# Patient Record
Sex: Female | Born: 1980
Health system: Southern US, Community
[De-identification: ages and names within clinical notes are randomized; demographics above are authoritative.]

## PROBLEM LIST (undated history)

## (undated) DIAGNOSIS — E559 Vitamin D deficiency, unspecified: Secondary | ICD-10-CM

## (undated) DIAGNOSIS — F329 Major depressive disorder, single episode, unspecified: Secondary | ICD-10-CM

## (undated) DIAGNOSIS — D229 Melanocytic nevi, unspecified: Secondary | ICD-10-CM

## (undated) DIAGNOSIS — G4700 Insomnia, unspecified: Secondary | ICD-10-CM

## (undated) DIAGNOSIS — J45909 Unspecified asthma, uncomplicated: Secondary | ICD-10-CM

## (undated) DIAGNOSIS — E538 Deficiency of other specified B group vitamins: Secondary | ICD-10-CM

## (undated) DIAGNOSIS — F419 Anxiety disorder, unspecified: Secondary | ICD-10-CM

## (undated) DIAGNOSIS — R739 Hyperglycemia, unspecified: Secondary | ICD-10-CM

## (undated) DIAGNOSIS — N6315 Unspecified lump in the right breast, overlapping quadrants: Secondary | ICD-10-CM

## (undated) DIAGNOSIS — R5383 Other fatigue: Secondary | ICD-10-CM

## (undated) DIAGNOSIS — L709 Acne, unspecified: Secondary | ICD-10-CM

## (undated) DIAGNOSIS — K589 Irritable bowel syndrome without diarrhea: Secondary | ICD-10-CM

## (undated) DIAGNOSIS — F32A Depression, unspecified: Secondary | ICD-10-CM

## (undated) DIAGNOSIS — N6310 Unspecified lump in the right breast, unspecified quadrant: Secondary | ICD-10-CM

## (undated) HISTORY — DX: Deficiency of other specified B group vitamins: E53.8

## (undated) HISTORY — PX: CHOLECYSTECTOMY: SHX55

## (undated) HISTORY — DX: Anxiety disorder, unspecified: F41.9

## (undated) HISTORY — DX: Unspecified lump in the right breast, unspecified quadrant: N63.10

## (undated) HISTORY — DX: Unspecified lump in the right breast, overlapping quadrants: N63.15

## (undated) HISTORY — DX: Other fatigue: R53.83

## (undated) HISTORY — DX: Unspecified asthma, uncomplicated: J45.909

## (undated) HISTORY — DX: Melanocytic nevi, unspecified: D22.9

## (undated) HISTORY — DX: Hyperglycemia, unspecified: R73.9

## (undated) HISTORY — DX: Depression, unspecified: F32.A

## (undated) HISTORY — DX: Major depressive disorder, single episode, unspecified: F32.9

## (undated) HISTORY — PX: TONSILLECTOMY AND ADENOIDECTOMY: SUR1326

## (undated) HISTORY — DX: Vitamin D deficiency, unspecified: E55.9

## (undated) HISTORY — DX: Insomnia, unspecified: G47.00

## (undated) HISTORY — DX: Irritable bowel syndrome, unspecified: K58.9

## (undated) HISTORY — DX: Acne, unspecified: L70.9

---

## 2008-08-19 ENCOUNTER — Inpatient Hospital Stay: Payer: Self-pay | Admitting: Unknown Physician Specialty

## 2009-01-18 ENCOUNTER — Ambulatory Visit: Payer: Self-pay | Admitting: Family Medicine

## 2009-03-01 ENCOUNTER — Ambulatory Visit: Payer: Self-pay | Admitting: Family Medicine

## 2009-03-01 IMAGING — CR DG LUMBAR SPINE 2-3V
1 series · 3 of 3 positions shown · non-contrast
Comparison: none

REASON FOR EXAM: back pain
COMMENTS:

[Series 2: view not recorded · 0.17mm/px · 3 of 3 slices shown]
[im 1/3]
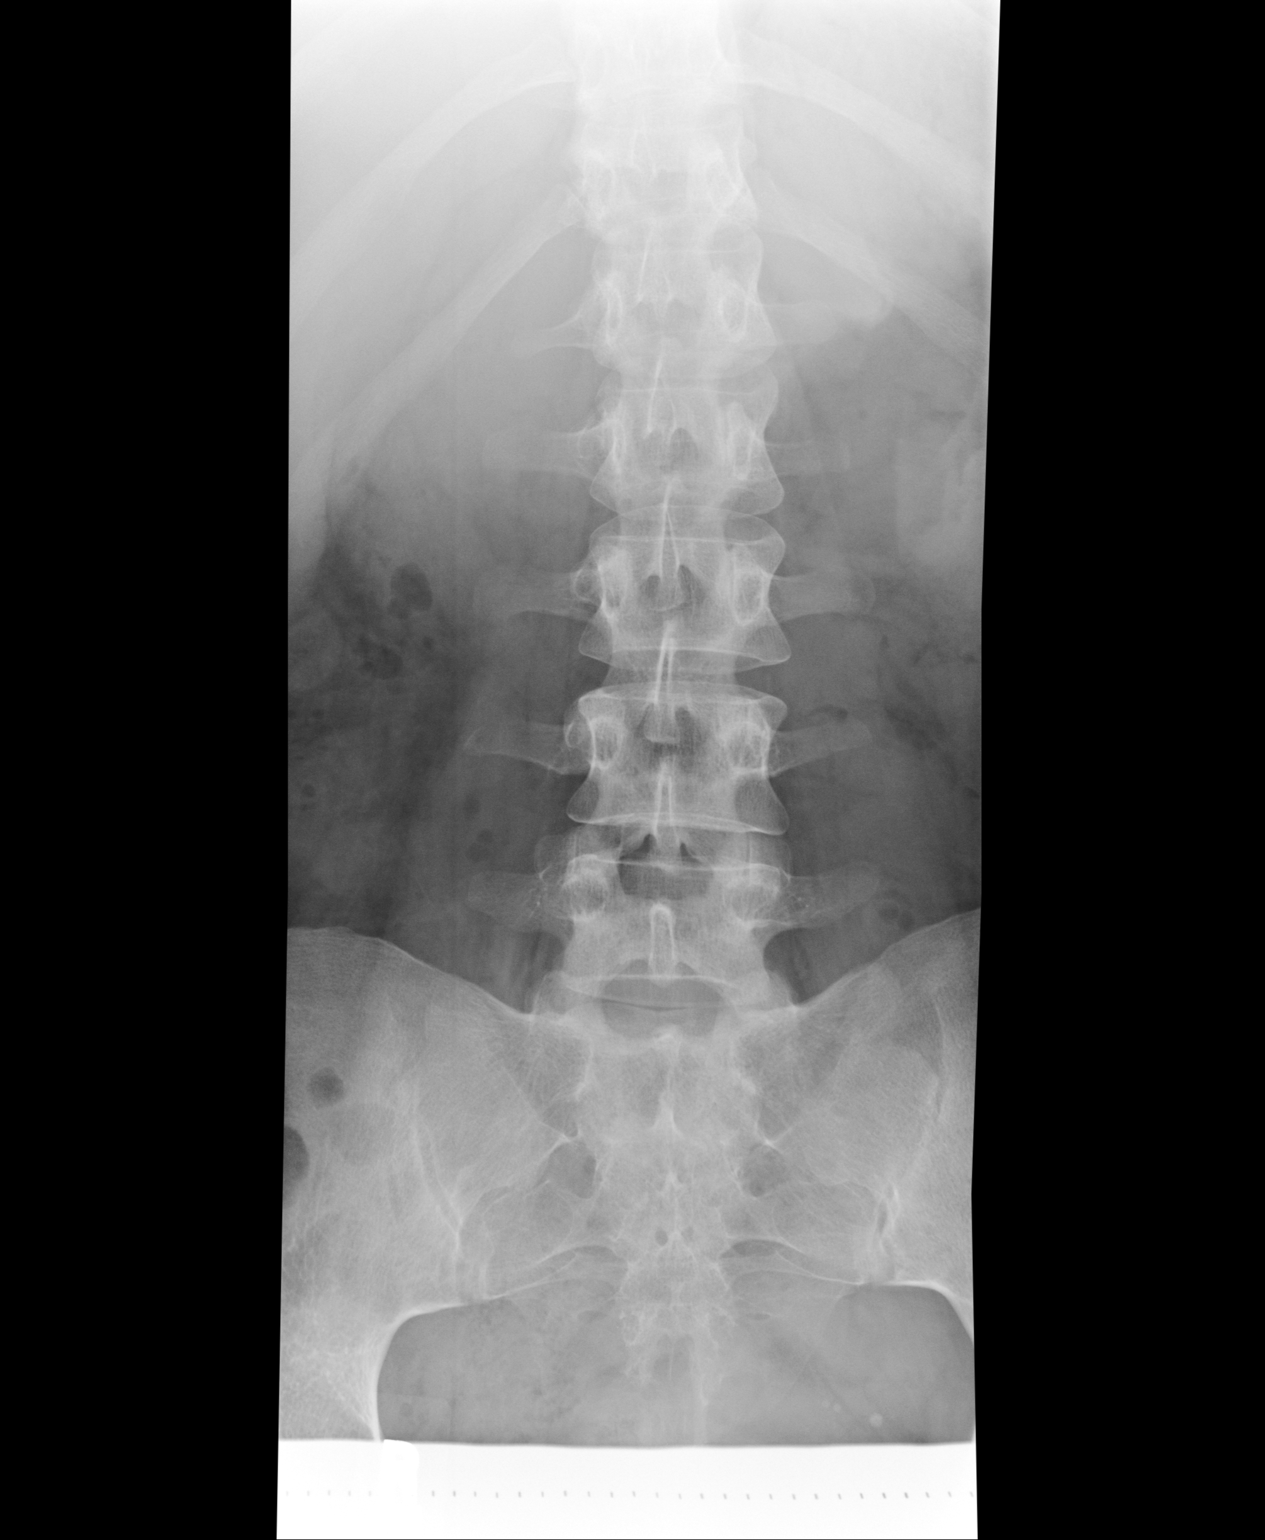
[im 2/3]
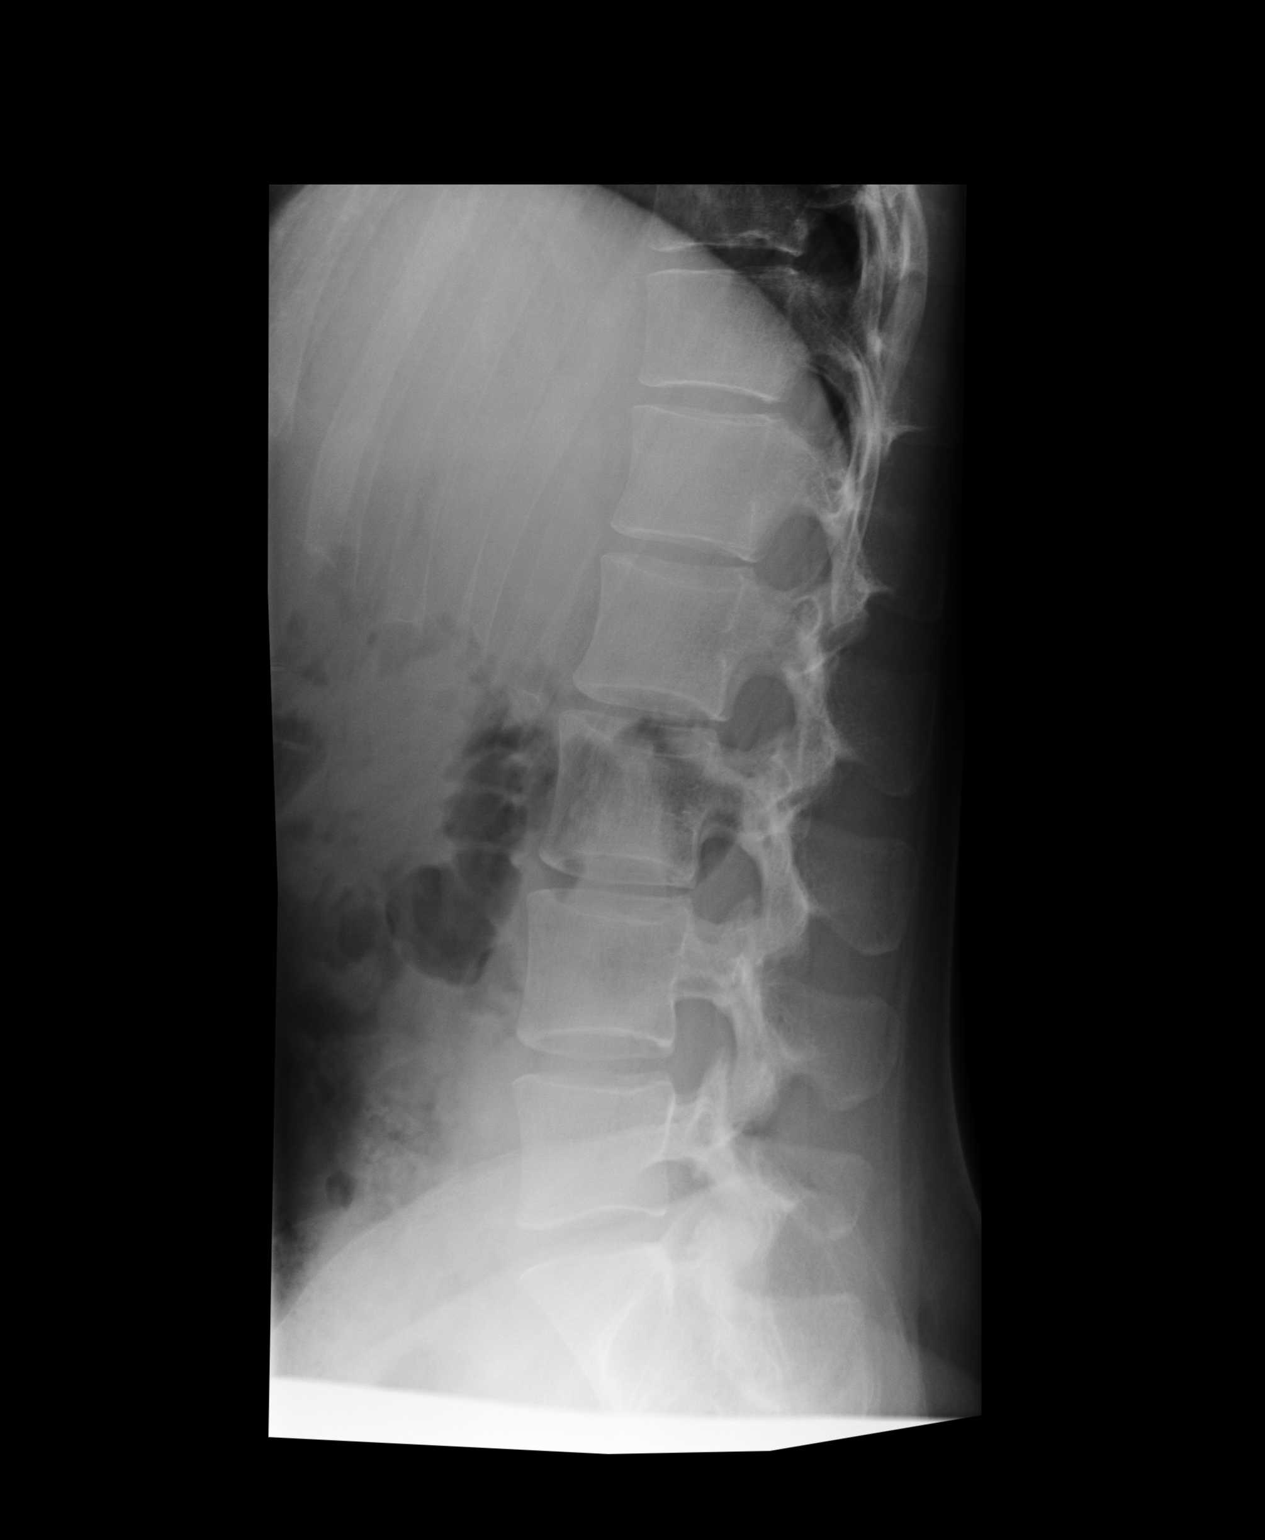
[im 3/3]
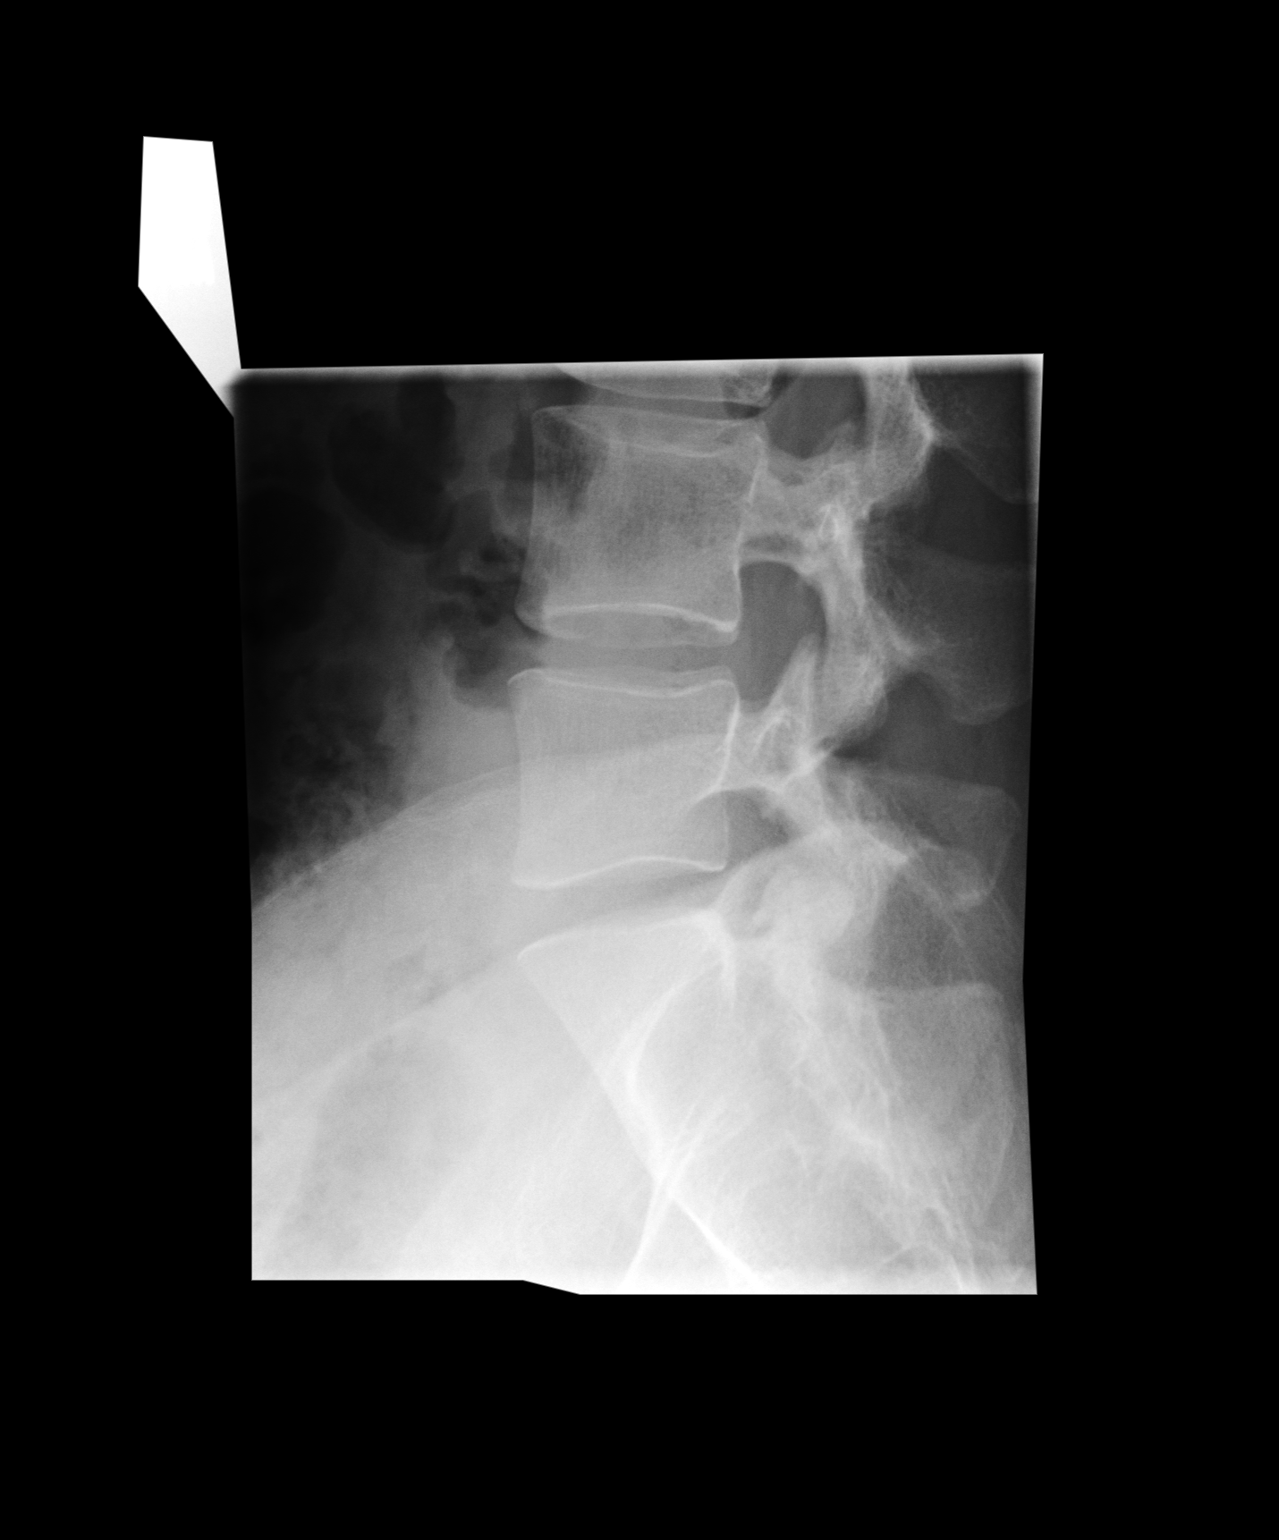

[3 of 3 positions shown; findings below may reference images not displayed]

PROCEDURE:     KDR - KDXR LUMBAR SPINE AP AND LATERAL  - [DATE]  [DATE]

RESULT:     There is mild levoscoliosis which is likely positional in
nature. Otherwise, five non-rib bearing lumbar vertebral bodies are
appreciated. There does not appear to be evidence of fracture, dislocation
or malalignment. Air is seen within non-dilated loops of bowel.
IMPRESSION: 1.     No acute osseous abnormality.
2.     If there is persistent clinical concern or persistent complaints of
pain, repeat evaluation in 7-10 days is recommended or alternatively MRI, if
clinically warranted.

## 2009-03-01 IMAGING — CR DG THORACIC SPINE 2-3V
1 series · 2 of 2 positions shown · non-contrast
Comparison: none

REASON FOR EXAM: back pain
COMMENTS:

PROCEDURE:     KDR - KDXR THORACIC AP AND LATERAL  - [DATE]  [DATE]
RESULT:     There is a mild dextroscoliosis. There is no evidence of acute
fracture or dislocation.

[Series 2: view not recorded · 0.17mm/px · 2 of 2 slices shown]
[im 1/2]
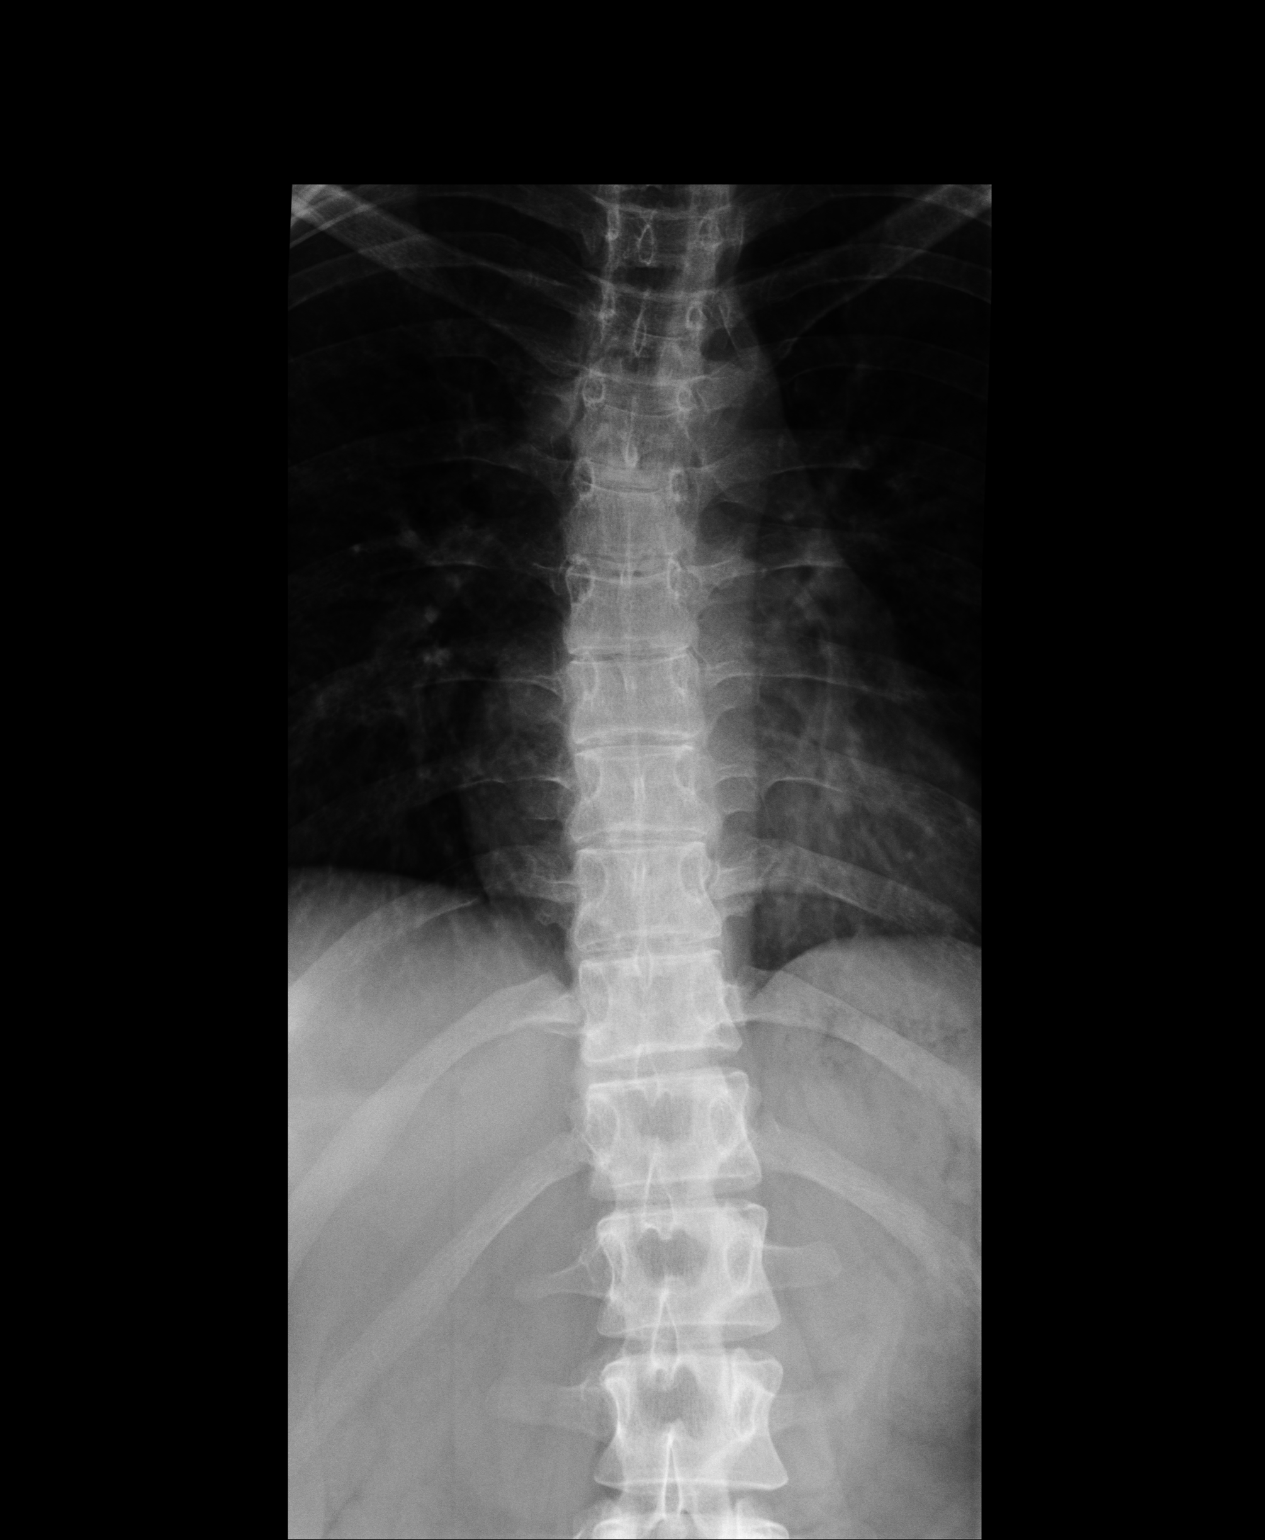
[im 2/2]
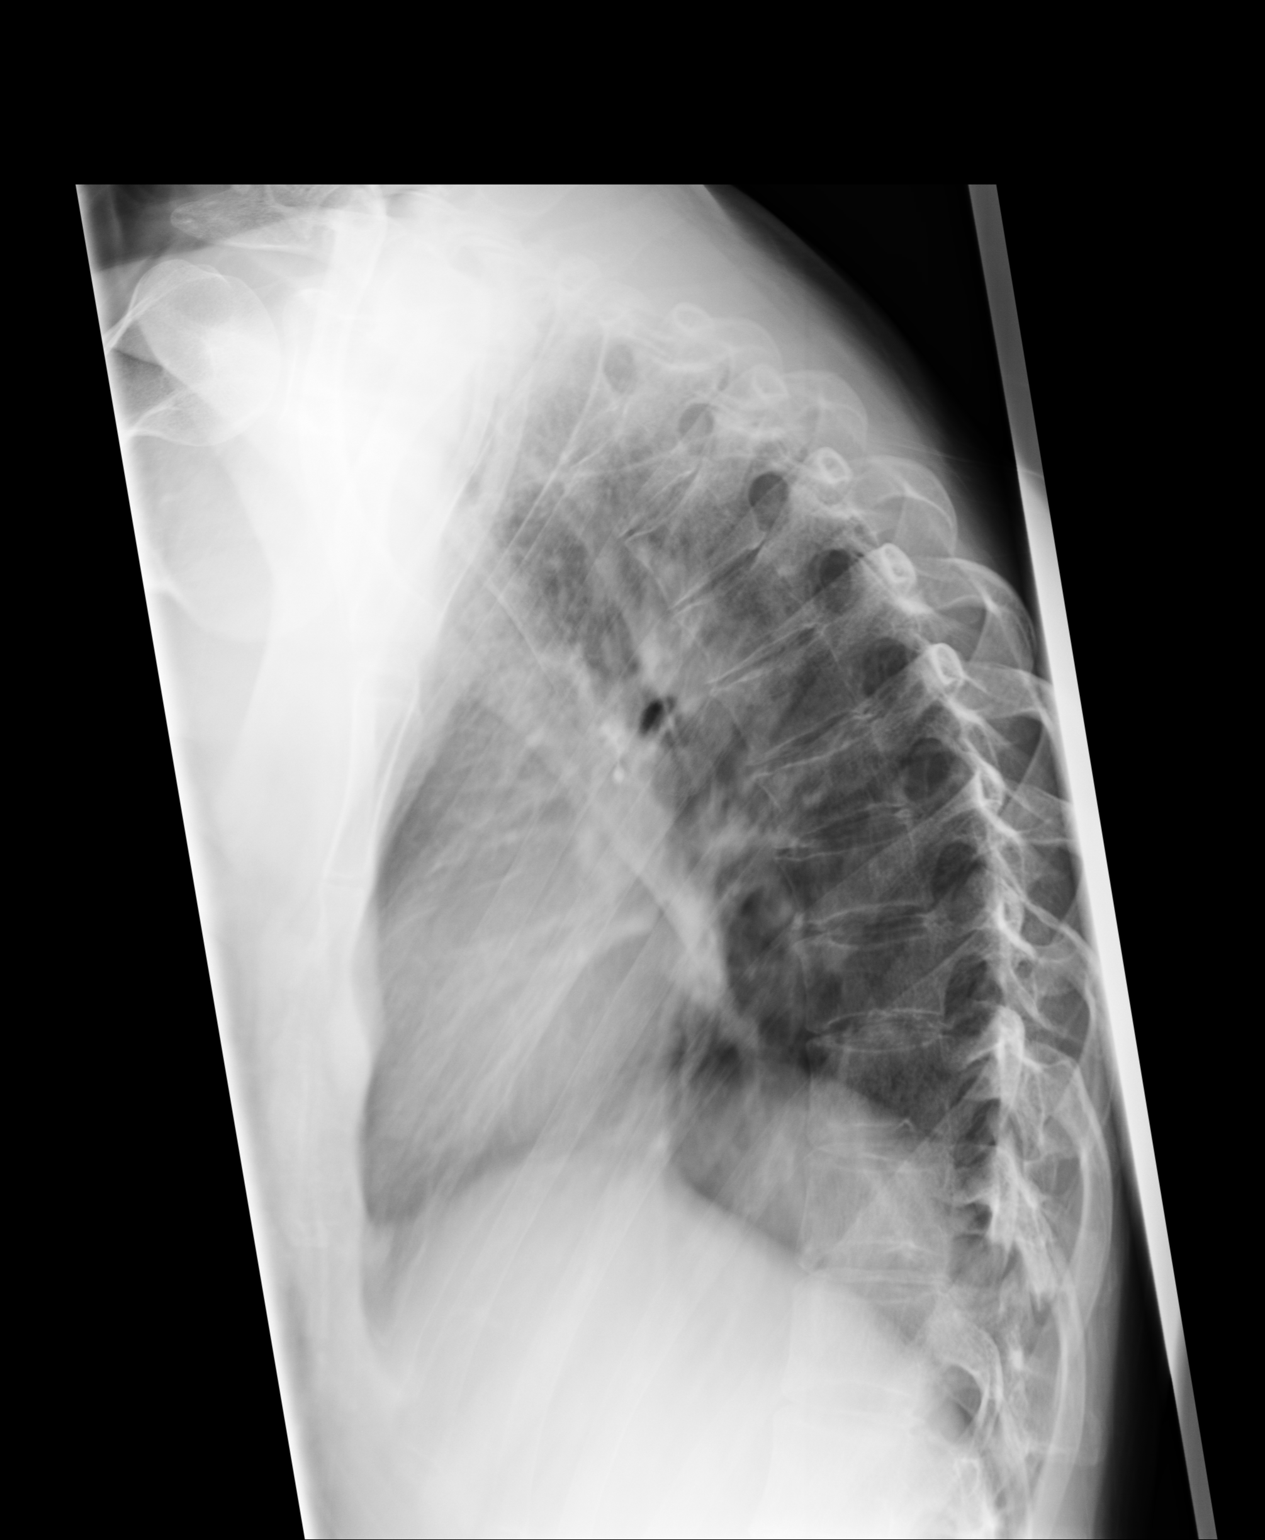

[2 of 2 positions shown; findings below may reference images not displayed]

IMPRESSION: 1.     Mild dextroscoliosis without evidence of acute osseous abnormalities.
2.     If there is persistent clinical concern, further evaluation with MRI
is recommended, if clinically warranted.

## 2009-08-16 IMAGING — CR DG CHEST 2V
1 series · 2 of 2 positions shown · non-contrast
Comparison: none

REASON FOR EXAM: acute bronchitis
COMMENTS:

PROCEDURE:     KDR - KDXR CHEST PA (OR AP) AND LAT  - [DATE] [DATE]
RESULT:     The lungs are mildly hyperinflated but clear. The heart is
normal in size. The pulmonary vascularity is not engorged. There is gentle
curvature of the thoracolumbar spine.

[Series 1: view not recorded · 0.17mm/px · 2 of 2 slices shown]
[im 1/2]
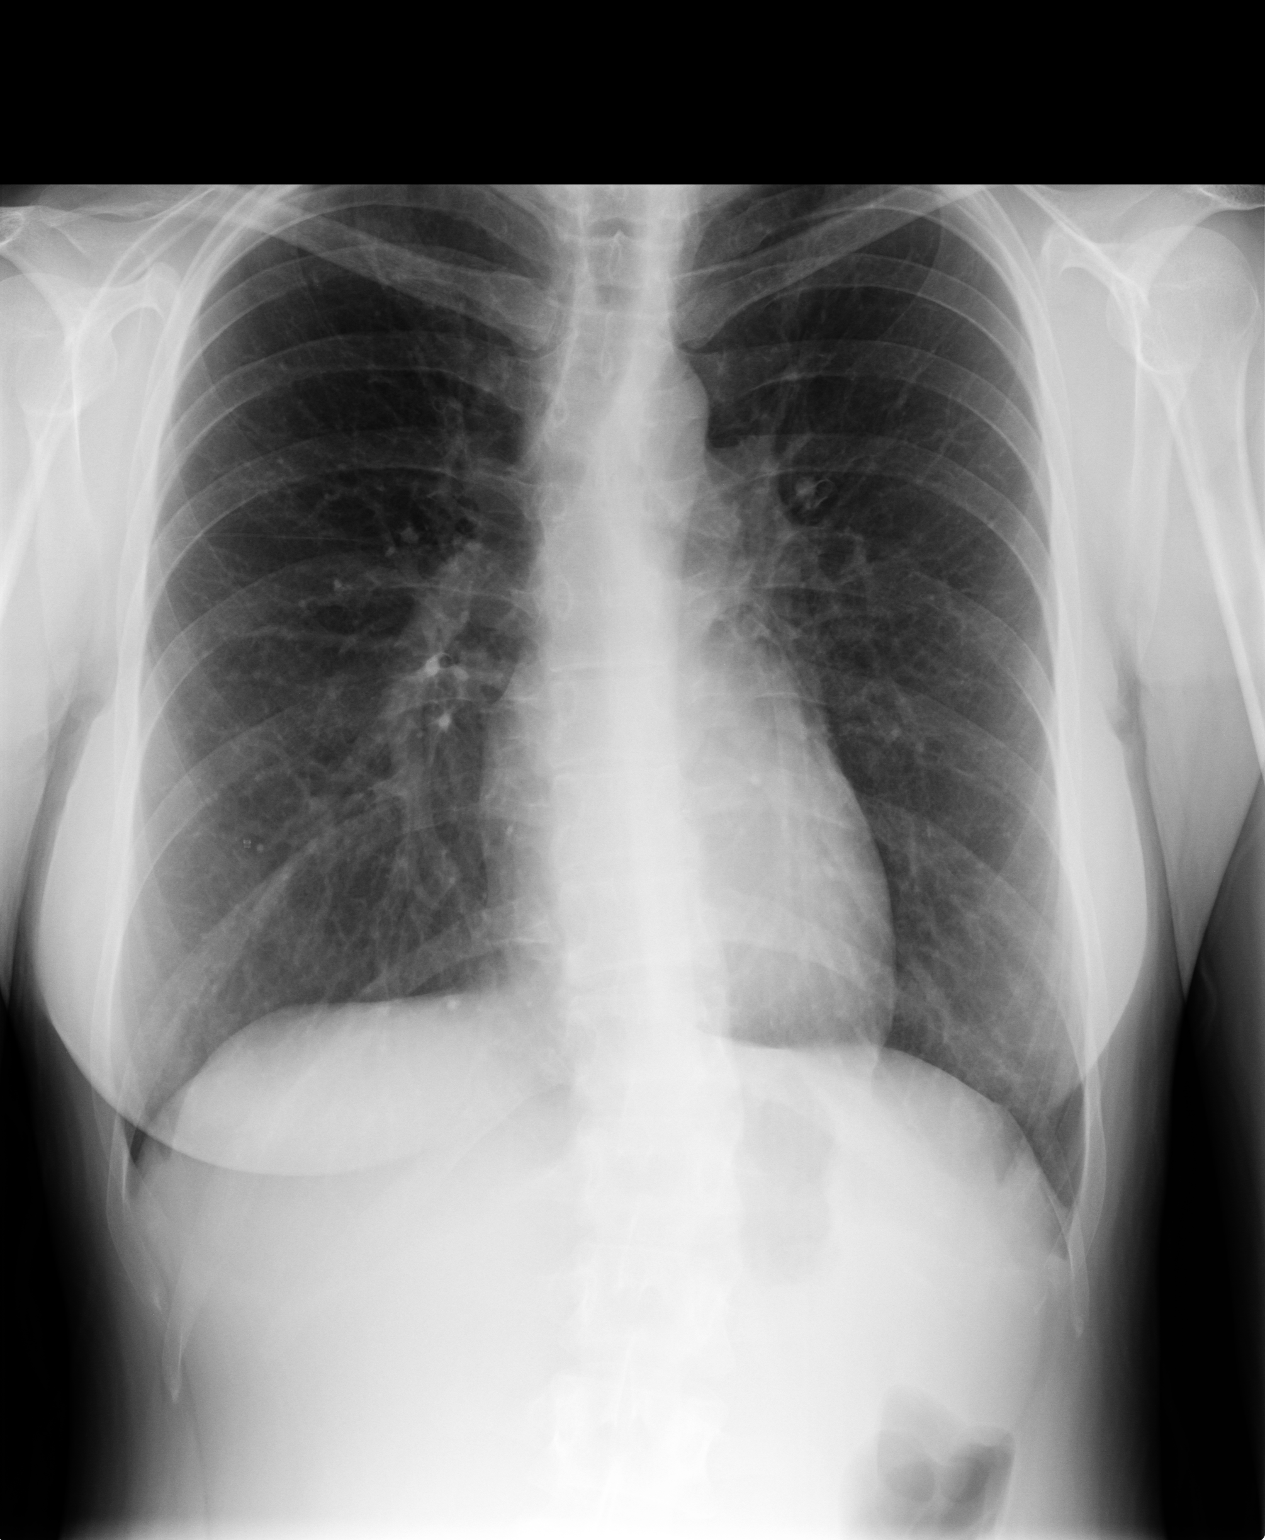
[im 2/2]
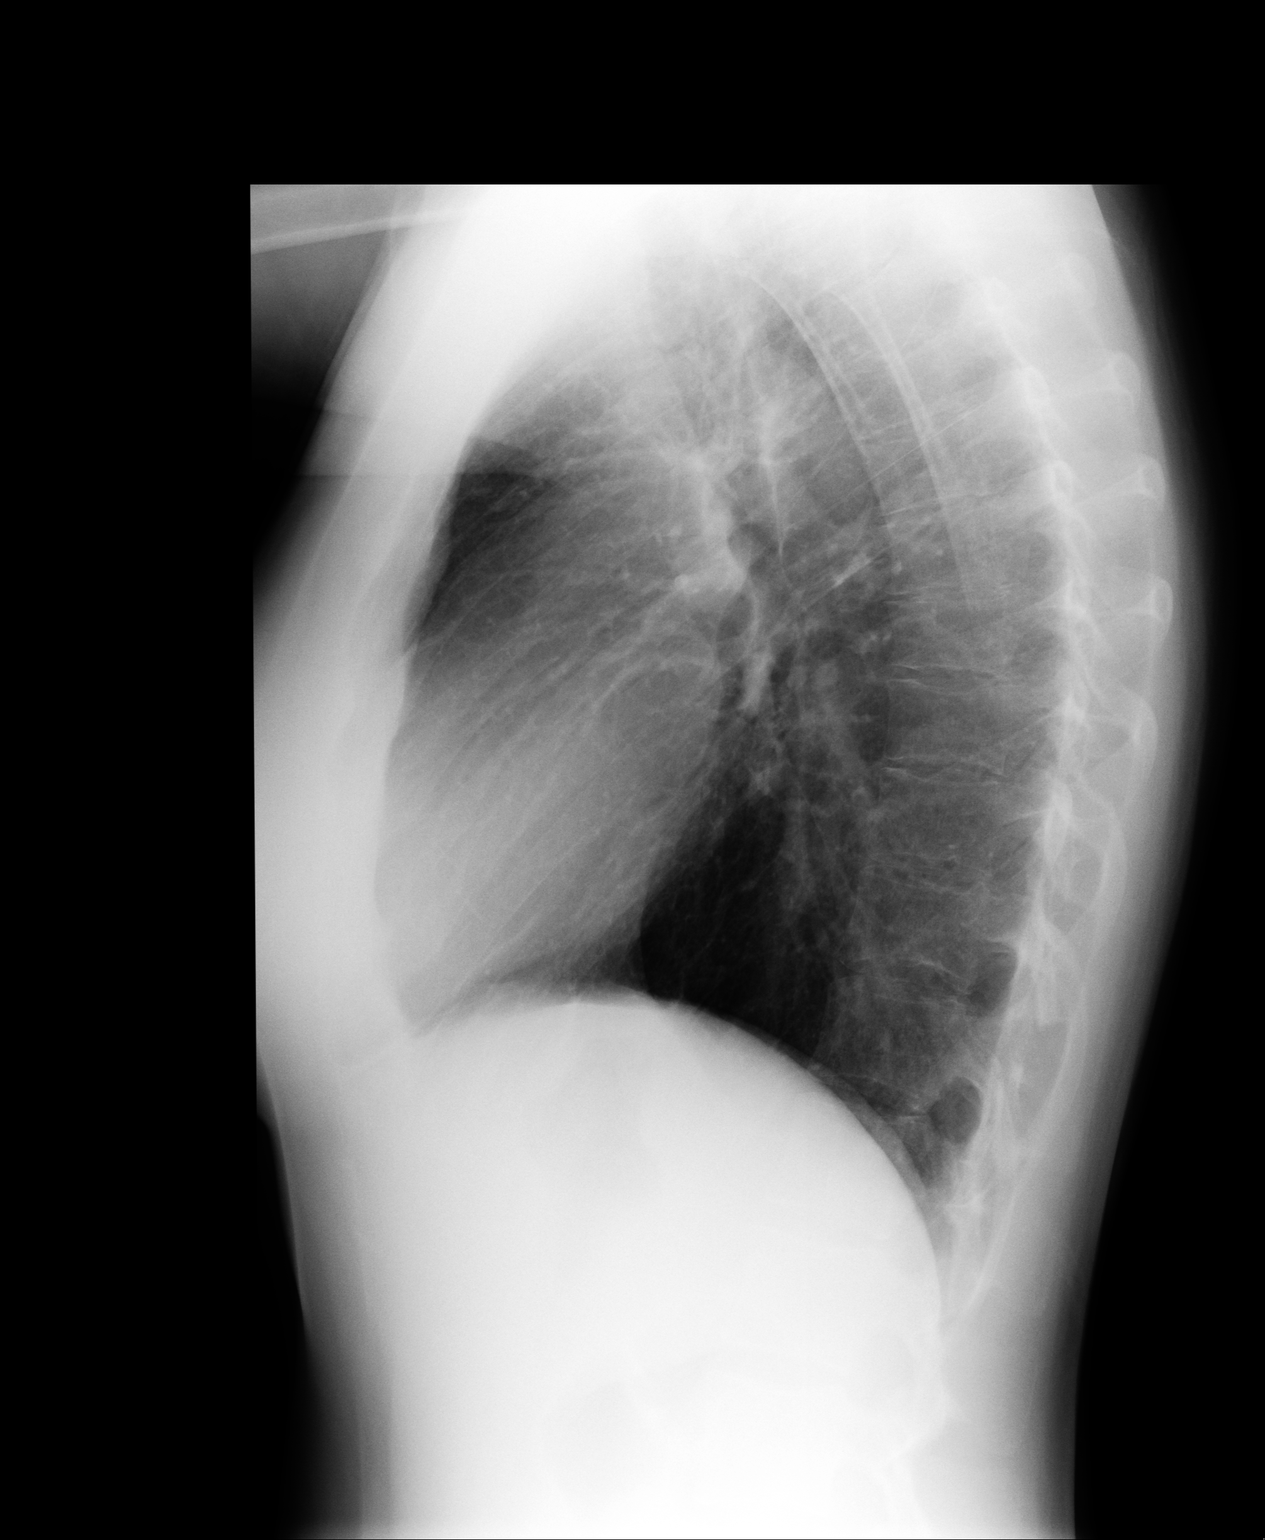

[2 of 2 positions shown; findings below may reference images not displayed]

IMPRESSION: There is mild hyperinflation of the lungs. This may reflect
an element of reactive airway disease and/or acute bronchitis. There is no
evidence of pneumonia.

## 2010-03-03 ENCOUNTER — Ambulatory Visit: Payer: Self-pay | Admitting: Obstetrics and Gynecology

## 2010-03-03 IMAGING — US ABDOMEN ULTRASOUND
1 series · 17 of 25 positions shown · non-contrast
Comparison: none

REASON FOR EXAM: ATTN GALLBLADDER  elevated bowel acids   pt is [TJ]
[TJ]  pregnant as of [DATE]
COMMENTS:

[Series 1: abdomen ultrasound · 17 of 68 slices shown]
[im 1/68]
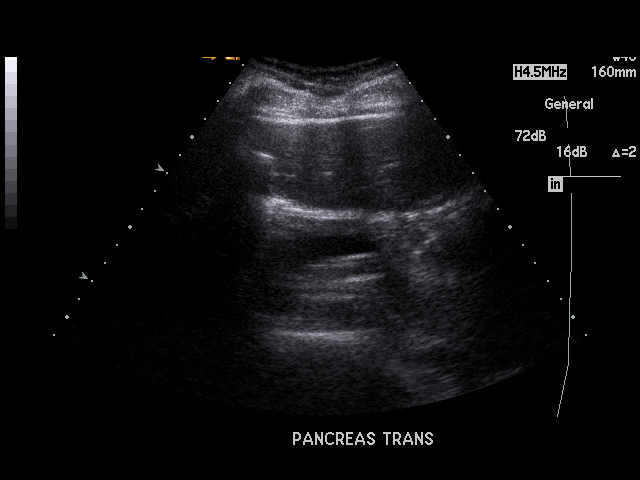
[im 6/68]
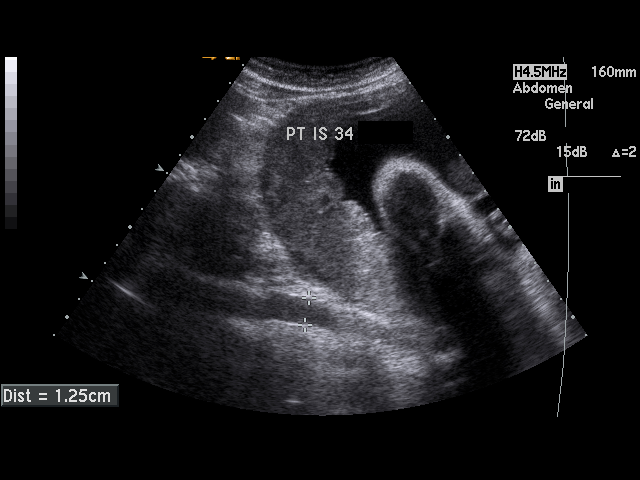
[im 9/68]
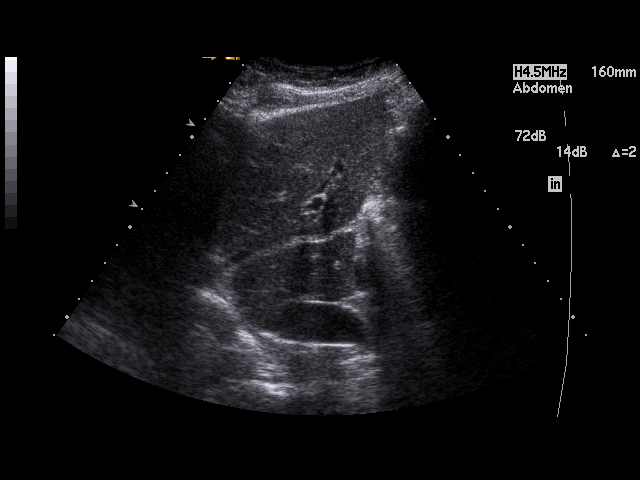
[im 14/68]
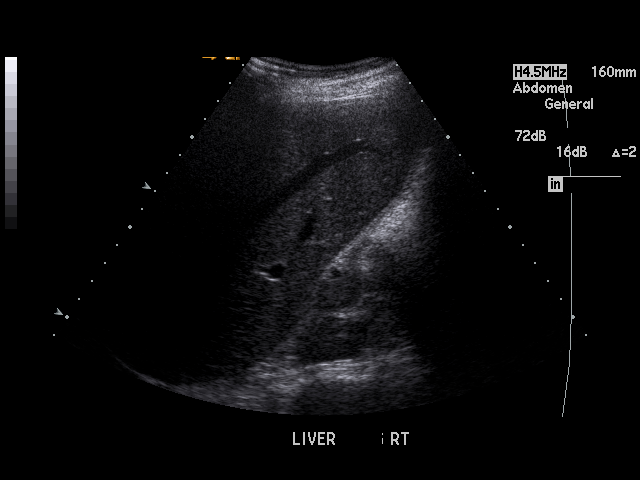
[im 17/68]
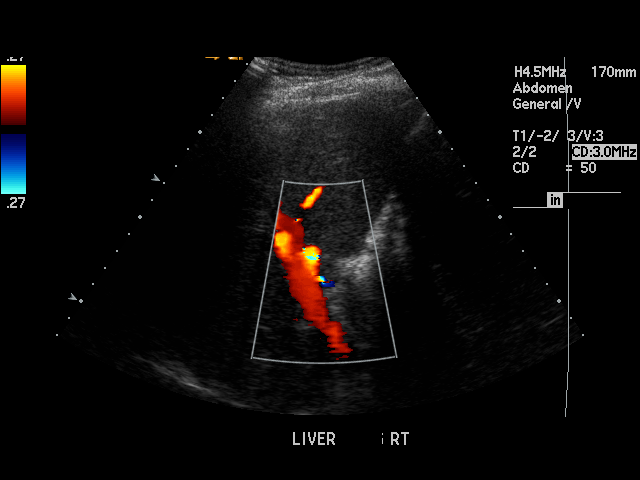
[im 23/68]
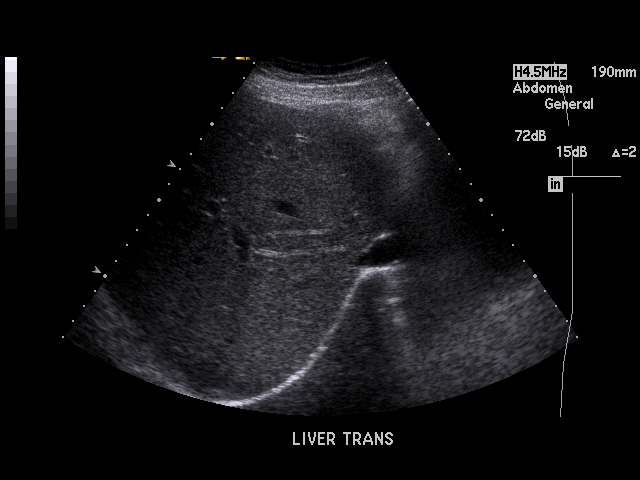
[im 26/68]
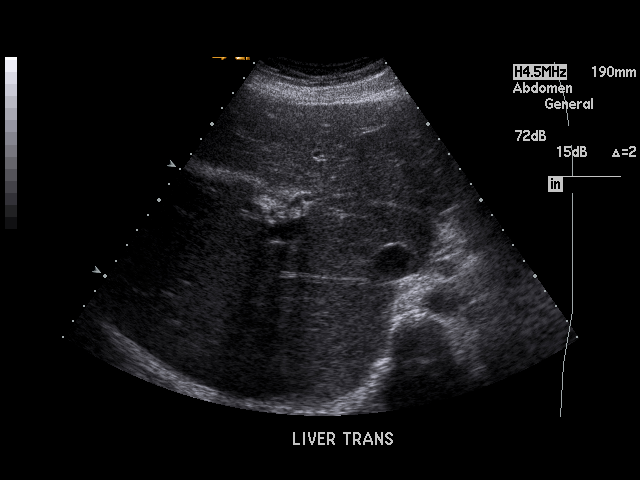
[im 31/68]
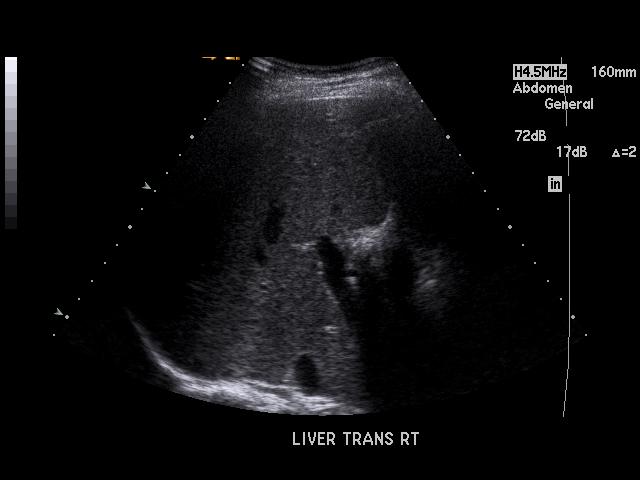
[im 34/68]
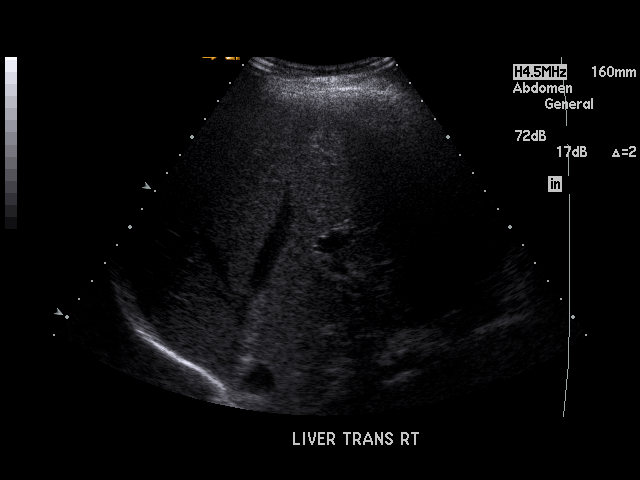
[im 37/68]
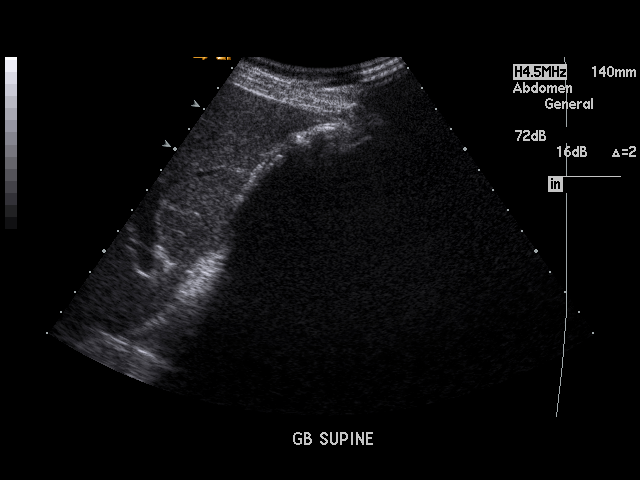
[im 42/68]
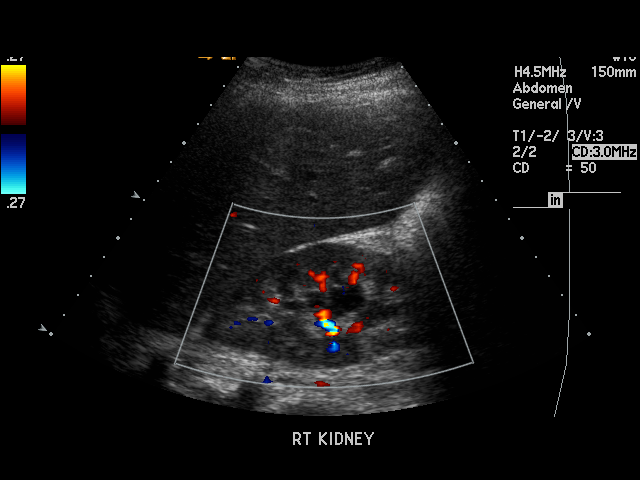
[im 45/68]
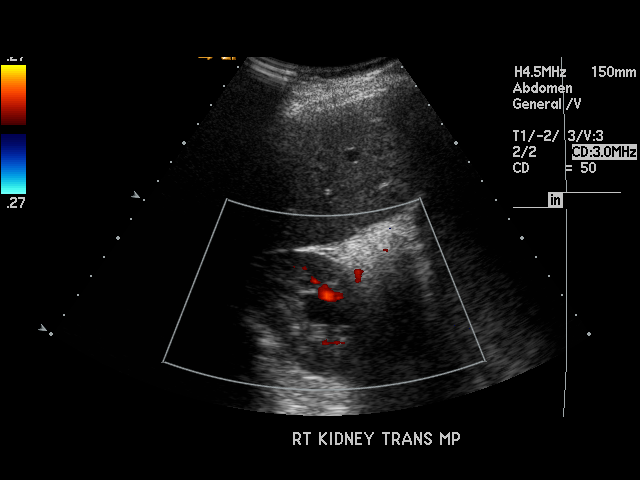
[im 51/68]
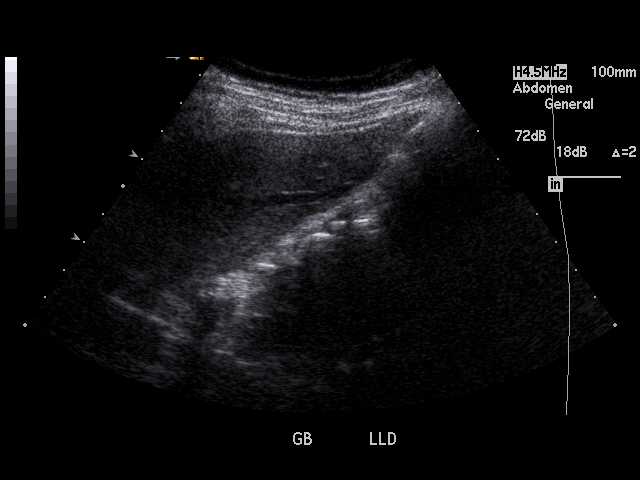
[im 54/68]
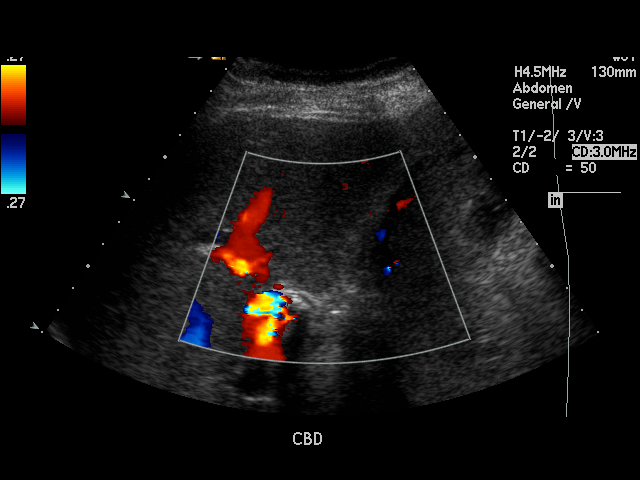
[im 59/68]
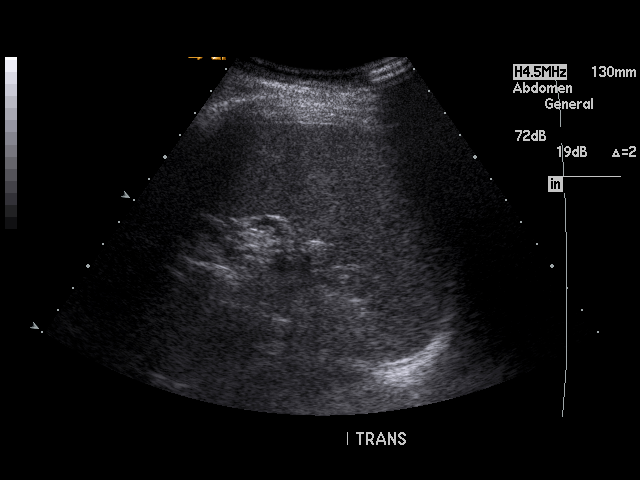
[im 62/68]
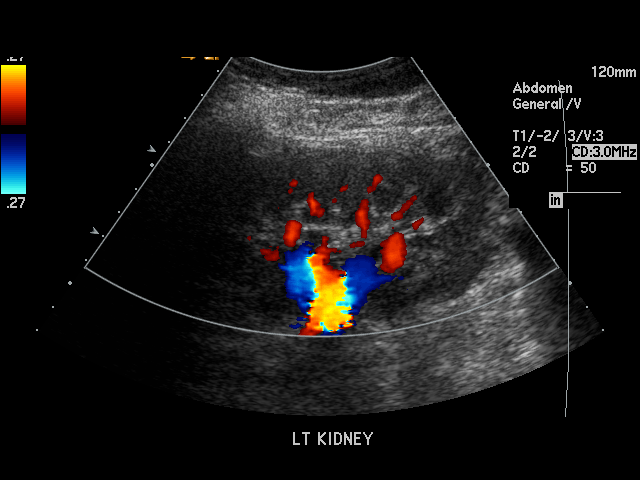
[im 68/68]
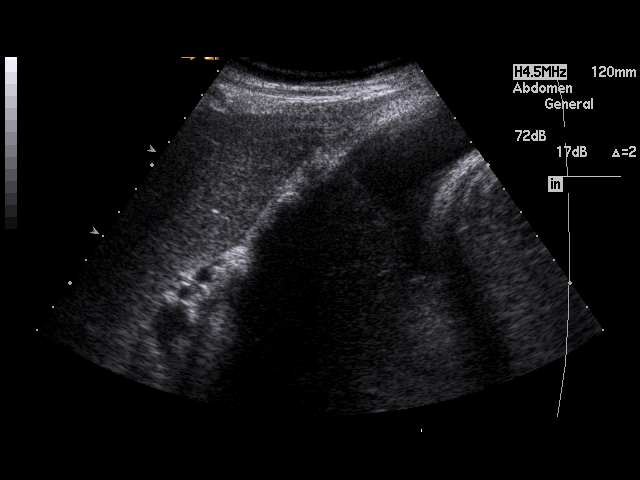

[17 of 25 positions shown; findings below may reference images not displayed]

PROCEDURE:     US  - US ABDOMEN GENERAL SURVEY  - [DATE]  [DATE]

RESULT:     The head and body of the pancreas is normal in appearance. The
pancreatic tail is obscured by bowel gas. The liver, spleen, abdominal aorta
and inferior vena cava show no significant abnormalities. There are
echodensities in the gallbladder compatible with gallstones in what appears
to be a contracted gallbladder. The gallbladder wall measures 3.2 mm in
thickness which is mildly thickened. No pericholecystic fluid is identified.
The common bile duct measures 2.7 mm in diameter which is within normal
limits. There is noted slight hydronephrosis on the right. The left kidney
is normal in appearance.
IMPRESSION: 1.  Cholelithiasis with there being slight thickening of the gallbladder
wall.
2.  Minimal hydronephrosis is observed on the right.
3.  No ascites is seen.

## 2010-03-20 ENCOUNTER — Observation Stay: Payer: Self-pay | Admitting: Obstetrics and Gynecology

## 2010-03-23 ENCOUNTER — Inpatient Hospital Stay: Payer: Self-pay | Admitting: Obstetrics and Gynecology

## 2010-04-17 ENCOUNTER — Ambulatory Visit: Payer: Self-pay | Admitting: Surgery

## 2010-04-24 ENCOUNTER — Ambulatory Visit: Payer: Self-pay | Admitting: Surgery

## 2010-04-28 ENCOUNTER — Emergency Department: Payer: Self-pay | Admitting: Internal Medicine

## 2010-04-28 IMAGING — CT CT NECK WITH CONTRAST
1 of 2 series · 9 of 14 positions shown, 12 images · IV contrast (agent unspecified)
Comparison: none

REASON FOR EXAM: thoat pain fever 1 week post intubation for surgical
procedure
COMMENTS:

PROCEDURE:     CT  - CT NECK WITH CONTRAST  - [DATE] [DATE]
RESULT:
TECHNIQUE: CT of the neck is performed emergently utilizing 60 ml of
[9I] iodinated intravenous contrast. Images are reconstructed at 3 mm
slice thickness in the axial plane.

[Series 3: soft tissue · axial · 0.44mm/px · z∈[+1051,+1276]mm · 9 of 95 slices shown, 12 images]
[im 10/95  soft-tissue]
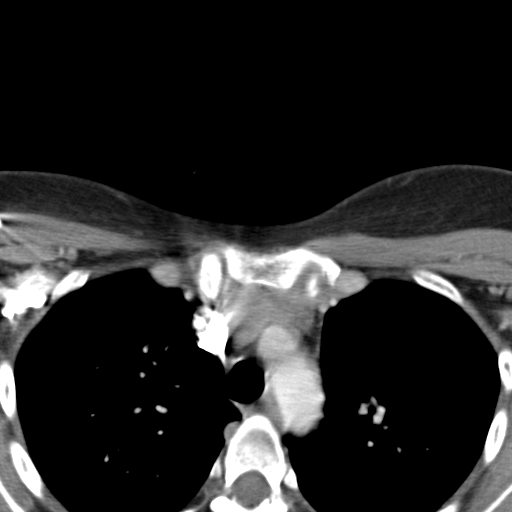
[im 10/95  bone]
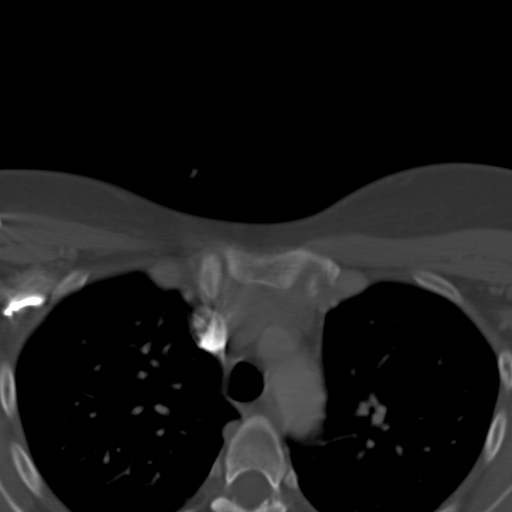
[im 19/95  bone]
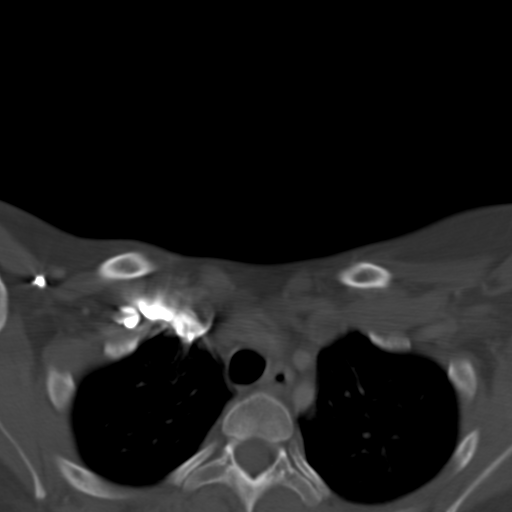
[im 29/95  bone]
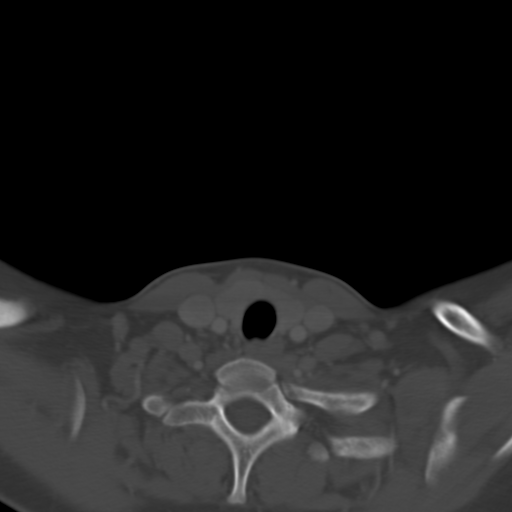
[im 38/95  bone]
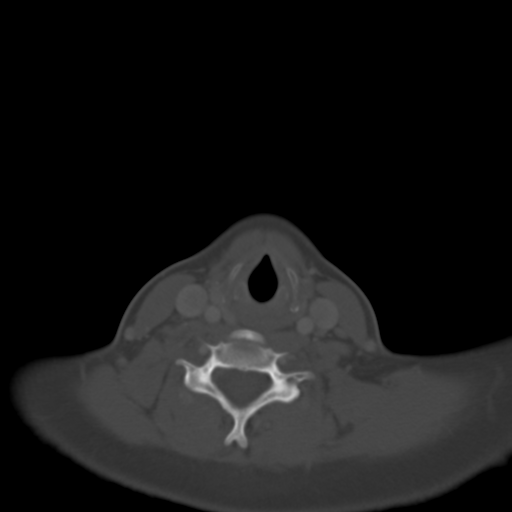
[im 48/95  soft-tissue]
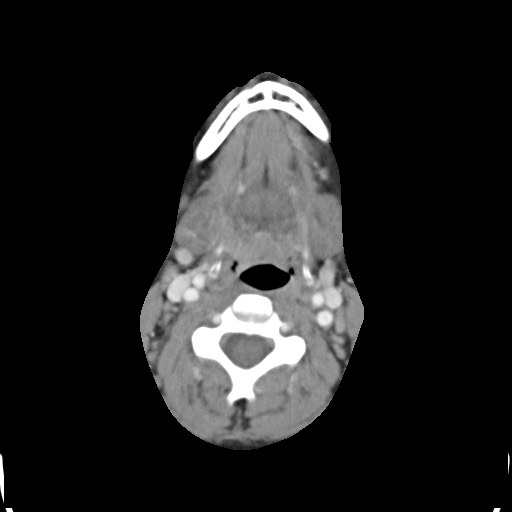
[im 48/95  bone]
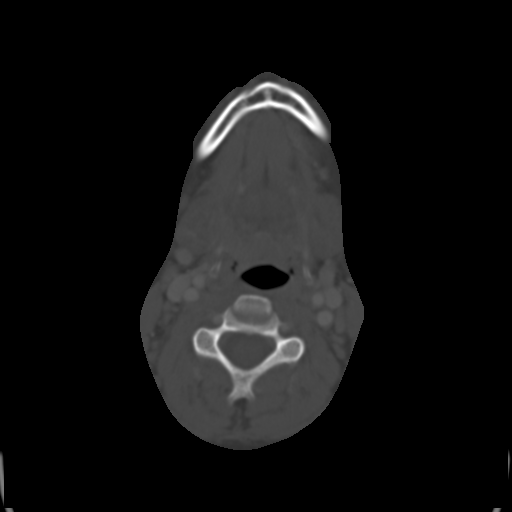
[im 57/95  bone]
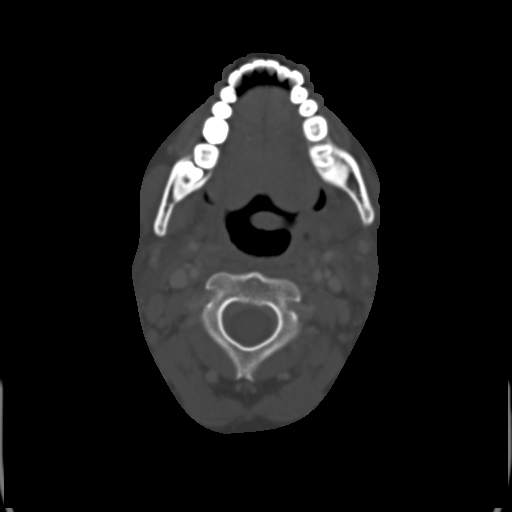
[im 66/95  bone]
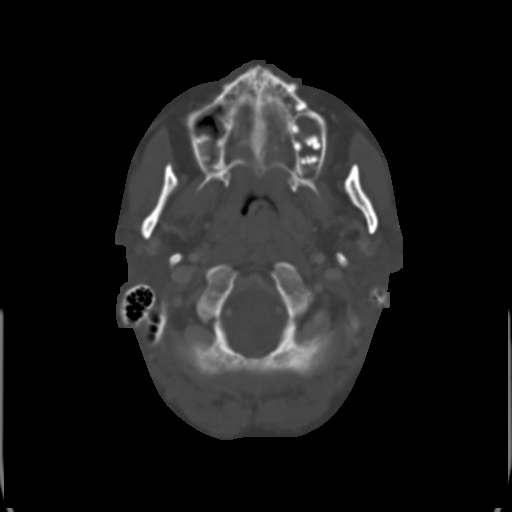
[im 76/95  bone]
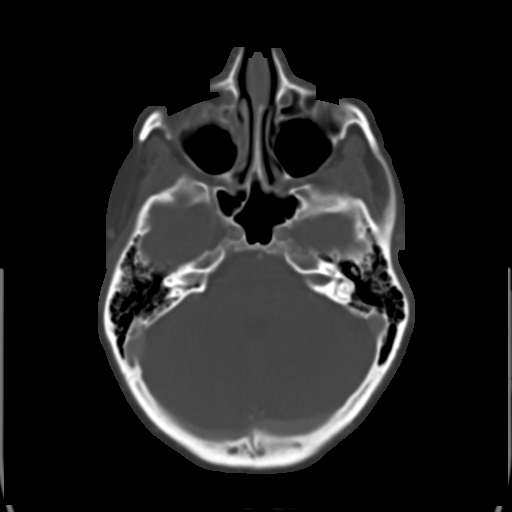
[im 85/95  soft-tissue]
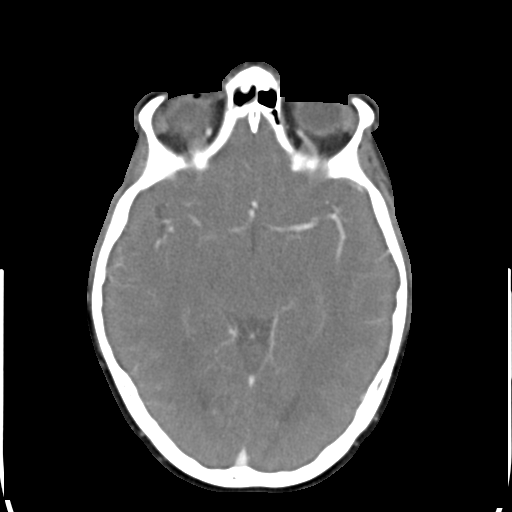
[im 85/95  bone]
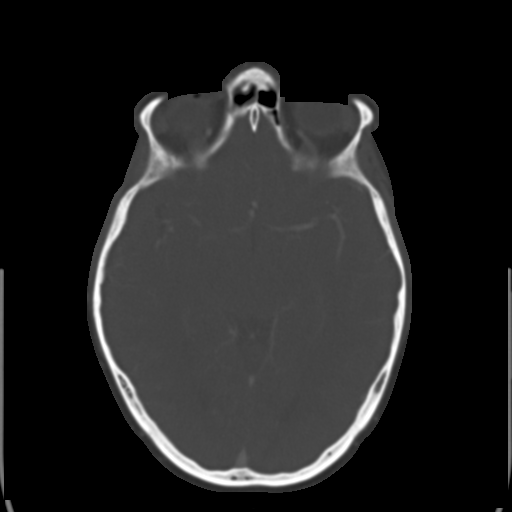

[9 of 14 positions shown; findings below may reference images not displayed]

FINDINGS: The base of the brain is unremarkable. The orbits and sinuses
appear to be within normal limits. The mastoids show normal aeration. The
lung apices are clear. Nonenlarged cervical lymph nodes are present. The
nasopharynx, oropharynx, prevertebral soft tissues and parapharyngeal soft
tissues appear unremarkable. There is no evidence of an abscess. The
salivary glands appear to be unremarkable. The supraglottic and infraglottic
regions are within normal limits. The thyroid lobes enhance homogeneously.
The tonsillar fossa and base of tongue appear to be unremarkable by CT.
IMPRESSION: No CT focal abnormality evident. Clinical follow-up is
recommended.

## 2010-06-16 DIAGNOSIS — F329 Major depressive disorder, single episode, unspecified: Secondary | ICD-10-CM

## 2010-06-16 DIAGNOSIS — F419 Anxiety disorder, unspecified: Secondary | ICD-10-CM

## 2010-06-16 DIAGNOSIS — F32A Depression, unspecified: Secondary | ICD-10-CM | POA: Insufficient documentation

## 2010-06-16 DIAGNOSIS — J45909 Unspecified asthma, uncomplicated: Secondary | ICD-10-CM | POA: Insufficient documentation

## 2012-10-09 ENCOUNTER — Other Ambulatory Visit: Payer: Self-pay | Admitting: Physician Assistant

## 2012-10-09 LAB — URINALYSIS, COMPLETE
Bilirubin,UR: NEGATIVE
Blood: NEGATIVE
Glucose,UR: NEGATIVE mg/dL (ref 0–75)
Ph: 7 (ref 4.5–8.0)
Protein: NEGATIVE
RBC,UR: 2 /HPF (ref 0–5)
Specific Gravity: 1.024 (ref 1.003–1.030)
WBC UR: 3 /HPF (ref 0–5)

## 2014-07-26 LAB — HM PAP SMEAR: HM Pap smear: NORMAL

## 2014-08-25 ENCOUNTER — Ambulatory Visit: Payer: Self-pay | Admitting: Family Medicine

## 2014-08-25 LAB — HM MAMMOGRAPHY: HM MAMMO: NORMAL

## 2015-01-07 ENCOUNTER — Ambulatory Visit: Payer: Self-pay

## 2015-01-09 LAB — URINE CULTURE

## 2015-02-03 LAB — HEMOGLOBIN A1C: HEMOGLOBIN A1C: 5.1 % (ref 4.0–6.0)

## 2015-08-05 ENCOUNTER — Encounter: Payer: Self-pay | Admitting: Family Medicine

## 2015-08-05 DIAGNOSIS — E559 Vitamin D deficiency, unspecified: Secondary | ICD-10-CM | POA: Insufficient documentation

## 2015-08-05 DIAGNOSIS — E538 Deficiency of other specified B group vitamins: Secondary | ICD-10-CM | POA: Insufficient documentation

## 2015-08-05 DIAGNOSIS — L709 Acne, unspecified: Secondary | ICD-10-CM | POA: Insufficient documentation

## 2015-08-05 DIAGNOSIS — Z8349 Family history of other endocrine, nutritional and metabolic diseases: Secondary | ICD-10-CM | POA: Insufficient documentation

## 2015-08-05 DIAGNOSIS — K589 Irritable bowel syndrome without diarrhea: Secondary | ICD-10-CM | POA: Insufficient documentation

## 2015-08-05 DIAGNOSIS — D229 Melanocytic nevi, unspecified: Secondary | ICD-10-CM | POA: Insufficient documentation

## 2015-08-05 DIAGNOSIS — R739 Hyperglycemia, unspecified: Secondary | ICD-10-CM | POA: Insufficient documentation

## 2015-08-09 ENCOUNTER — Ambulatory Visit: Payer: Self-pay | Admitting: Family Medicine

## 2015-08-11 ENCOUNTER — Encounter: Payer: Self-pay | Admitting: Family Medicine

## 2015-08-11 ENCOUNTER — Ambulatory Visit (INDEPENDENT_AMBULATORY_CARE_PROVIDER_SITE_OTHER): Payer: 59 | Admitting: Family Medicine

## 2015-08-11 VITALS — BP 114/68 | HR 85 | Temp 97.6°F | Resp 16 | Ht 69.0 in | Wt 159.2 lb

## 2015-08-11 DIAGNOSIS — J4520 Mild intermittent asthma, uncomplicated: Secondary | ICD-10-CM | POA: Diagnosis not present

## 2015-08-11 DIAGNOSIS — E538 Deficiency of other specified B group vitamins: Secondary | ICD-10-CM

## 2015-08-11 DIAGNOSIS — E559 Vitamin D deficiency, unspecified: Secondary | ICD-10-CM

## 2015-08-11 DIAGNOSIS — F418 Other specified anxiety disorders: Secondary | ICD-10-CM

## 2015-08-11 DIAGNOSIS — F410 Panic disorder [episodic paroxysmal anxiety] without agoraphobia: Secondary | ICD-10-CM | POA: Insufficient documentation

## 2015-08-11 DIAGNOSIS — F419 Anxiety disorder, unspecified: Secondary | ICD-10-CM

## 2015-08-11 DIAGNOSIS — F329 Major depressive disorder, single episode, unspecified: Secondary | ICD-10-CM

## 2015-08-11 DIAGNOSIS — F32A Depression, unspecified: Secondary | ICD-10-CM

## 2015-08-11 DIAGNOSIS — K589 Irritable bowel syndrome without diarrhea: Secondary | ICD-10-CM

## 2015-08-11 MED ORDER — ALPRAZOLAM 0.5 MG PO TABS: 0.5000 mg | ORAL_TABLET | Freq: Every evening | ORAL | Status: DC | PRN

## 2015-08-11 MED ORDER — ALBUTEROL SULFATE HFA 108 (90 BASE) MCG/ACT IN AERS: 2.0000 | INHALATION_SPRAY | Freq: Four times a day (QID) | RESPIRATORY_TRACT | Status: DC | PRN

## 2015-08-11 NOTE — Progress Notes (Signed)
   08/11/15 1208  Asthma History  Symptoms 0-2 days/week  Nighttime Awakenings 0-2/month  Asthma interference with normal activity No limitations  SABA use (not for EIB) 0-2 days/wk  Risk: Exacerbations requiring oral systemic steroids 0-1 / year  Asthma Severity Intermittent

## 2015-08-11 NOTE — Progress Notes (Signed)
Name: Ashley Patel   MRN: [id number]    DOB: [birth year]   Date:08/11/2015       Progress Note  Subjective  Chief Complaint  Chief Complaint  Patient presents with  . Medication Refill    6 month F/U  . Depression    Well controlled, stopped medication on her own 3 months ago, no side effects/symptoms from stopping.     HPI    08/11/15 1208  Asthma History  Symptoms 0-2 days/week  Nighttime Awakenings 0-2/month  Asthma interference with normal activity No limitations  SABA use (not for EIB) 0-2 days/wk  Risk: Exacerbations requiring oral systemic steroids 0-1 / year  Asthma Severity Intermittent  Asthma Mild Intermittently: she states she has not had any symptoms of asthma in about three years, no SOB, no wheezing or cough  IBS: she has symptoms 3- 5 times weekly, she has abdominal pain/cramping and the need to have a bowel movement , alternates with constipation and diarrhea. No weight changes.   Depression: she stopped medications about three months ago. She is feeling well, denies fatigue, crying spells, mood changes. Sleeping well. She still has anxiety - and recently had a panic attack that lasted a few hours but able to calm herself down.    Patient Active Problem List   Diagnosis Date Noted  . Panic attack 08/11/2015  . B12 deficiency 08/05/2015  . Acne 08/05/2015  . Blood glucose elevated 08/05/2015  . IBS (irritable bowel syndrome) 08/05/2015  . Numerous moles 08/05/2015  . Vitamin D deficiency 08/05/2015  . Family history of thyroid disease 08/05/2015  . Asthma without status asthmaticus 06/16/2010  . Anxiety and depression 06/16/2010    Past Surgical History  Procedure Laterality Date  . Tonsillectomy and adenoidectomy    . Cholecystectomy      Family History  Problem Relation Age of Onset  . Cardiomyopathy Mother   . Macrocephaly Son     Social History   Social History  . Marital Status: Married    Spouse Name: N/A  . Number of Children: N/A  .  Years of Education: N/A   Occupational History  . Not on file.   Social History Main Topics  . Smoking status: Former Smoker -- 0.50 packs/day for 5 years    Types: Cigarettes    Start date: 12/04/2003    Quit date: 11/04/2009  . Smokeless tobacco: Never Used  . Alcohol Use: 0.0 oz/week    0 Standard drinks or equivalent per week     Comment: occasionally  . Drug Use: No  . Sexual Activity:    Partners: Male    Birth Control/ Protection: Implant   Other Topics Concern  . Not on file   Social History Narrative     Current outpatient prescriptions:  .  cyanocobalamin 100 MCG tablet, Place under the tongue., Disp: , Rfl:  .  levonorgestrel (MIRENA, 52 MG,) 20 MCG/24HR IUD, by Intrauterine route., Disp: , Rfl:  .  albuterol (VENTOLIN HFA) 108 (90 BASE) MCG/ACT inhaler, Inhale 2 puffs into the lungs every 6 (six) hours as needed for wheezing or shortness of breath., Disp: 1 Inhaler, Rfl: 0  Allergies  Allergen Reactions  . Cymbalta [Duloxetine Hcl]     Abdominal Pain     ROS  Constitutional: Negative for fever or weight change.  Respiratory: Negative for cough and shortness of breath.   Cardiovascular: Negative for chest pain or palpitations.  Gastrointestinal: Positive  for abdominal pain Musculoskeletal: Negative for gait  problem or joint swelling.  Skin: Negative for rash.  Neurological: Negative for dizziness or headache.  No other specific complaints in a complete review of systems (except as listed in HPI above).  Objective  Filed Vitals:   08/11/15 1126  BP: 114/68  Pulse: 85  Temp: 97.6 F (36.4 C)  TempSrc: Oral  Resp: 16  Height: 5\' 9"  (1.753 m)  Weight: 159 lb 3.2 oz (72.213 kg)  SpO2: 98%    Body mass index is 23.5 kg/(m^2).  Physical Exam  Constitutional: Patient appears well-developed and well-nourished.  No distress.  HEENT: head atraumatic, normocephalic, pupils equal and reactive to light,  neck supple, throat within normal  limits Cardiovascular: Normal rate, regular rhythm and normal heart sounds.  No murmur heard. No BLE edema. Pulmonary/Chest: Effort normal and breath sounds normal. No respiratory distress. Abdominal: Soft.  There is no tenderness. Psychiatric: Patient has a normal mood and affect. behavior is normal. Judgment and thought content normal.   PHQ2/9: Depression screen PHQ 2/9 08/11/2015  Decreased Interest 0  Down, Depressed, Hopeless 0  PHQ - 2 Score 0     Fall Risk: Fall Risk  08/11/2015  Falls in the past year? No      Functional Status Survey: Is the patient deaf or have difficulty hearing?: No Does the patient have difficulty seeing, even when wearing glasses/contacts?: No Does the patient have difficulty concentrating, remembering, or making decisions?: No Does the patient have difficulty walking or climbing stairs?: No Does the patient have difficulty dressing or bathing?: No Does the patient have difficulty doing errands alone such as visiting a doctor's office or shopping?: No    Assessment & Plan  1. Asthma without status asthmaticus, mild intermittent, uncomplicated  - albuterol (VENTOLIN HFA) 108 (90 BASE) MCG/ACT inhaler; Inhale 2 puffs into the lungs every 6 (six) hours as needed for wheezing or shortness of breath.  Dispense: 1 Inhaler; Refill: 0  2. Anxiety and depression  Doing well, advised to call back if symptoms gets worse  3. B12 deficiency  - Vitamin B12  4. IBS (irritable bowel syndrome)  She does not want medication for it  5. Vitamin D deficiency  - Vit D  25 hydroxy (rtn osteoporosis monitoring)  6. Panic attack  - ALPRAZolam (XANAX) 0.5 MG tablet; Take 1 tablet (0.5 mg total) by mouth at bedtime as needed for anxiety.  Dispense: 30 tablet; Refill: 0

## 2015-09-16 ENCOUNTER — Ambulatory Visit: Payer: Self-pay | Admitting: Physician Assistant

## 2015-09-16 ENCOUNTER — Encounter: Payer: Self-pay | Admitting: Physician Assistant

## 2015-09-16 VITALS — BP 130/58 | Temp 98.7°F

## 2015-09-16 DIAGNOSIS — N390 Urinary tract infection, site not specified: Secondary | ICD-10-CM

## 2015-09-16 LAB — POCT URINALYSIS DIPSTICK
Bilirubin, UA: NEGATIVE
Glucose, UA: NEGATIVE
Ketones, UA: NEGATIVE
LEUKOCYTES UA: NEGATIVE
NITRITE UA: NEGATIVE
PH UA: 7.5
Protein, UA: NEGATIVE
RBC UA: NEGATIVE
Spec Grav, UA: 1.02
UROBILINOGEN UA: 1

## 2015-09-16 NOTE — Progress Notes (Signed)
Suspect UTI  Subjective:    Patient ID: Ashley Patel, female    DOB: [birth year], 34 y.o.   MRN: [id number]  HPI Back c/o mild right flank pain. Concern for UTI. Patient denies dysuria, frequency, or urgency. Denies fever/chill, N/V/ or vaginal discharge. States want to be sure since the weekend is approaching.     Review of Systems Asthma, IBS, anxiety, and Depression.     Objective:   Physical Exam No acute distress. No spinal deformity. No CVA guarding.  Full and equal range of motin.  No abdmonial distension, normal bowel sound, soft and non-tender to palpation.  Dip UA unremarkable.       Assessment & Plan:  Mild right flank pain. Advised supportive care and follow up as needed.

## 2015-11-09 ENCOUNTER — Encounter: Payer: 59 | Admitting: Family Medicine

## 2016-01-19 ENCOUNTER — Ambulatory Visit: Payer: Self-pay | Admitting: Physician Assistant

## 2016-01-19 ENCOUNTER — Encounter: Payer: Self-pay | Admitting: Physician Assistant

## 2016-01-19 VITALS — BP 120/72 | HR 72 | Temp 98.6°F

## 2016-01-19 DIAGNOSIS — R52 Pain, unspecified: Secondary | ICD-10-CM

## 2016-01-19 DIAGNOSIS — J069 Acute upper respiratory infection, unspecified: Secondary | ICD-10-CM

## 2016-01-19 LAB — POCT RAPID STREP A (OFFICE): Rapid Strep A Screen: NEGATIVE

## 2016-01-19 LAB — POCT INFLUENZA A/B
Influenza A, POC: NEGATIVE
Influenza B, POC: NEGATIVE

## 2016-01-19 MED ORDER — CEFDINIR 300 MG PO CAPS: 300.0000 mg | ORAL_CAPSULE | Freq: Two times a day (BID) | ORAL | Status: DC

## 2016-01-19 NOTE — Progress Notes (Signed)
S: C/o runny nose and congestion for 3 days, sore throat, body aches, ?low grade fever, no chills, cp/sob, v/d; mucus was green this am but clear throughout the day, cough is sporadic, both children have had strep in last week  Using otc meds: dayquil  O: PE: vitals wnl, nad, perrl eomi, normocephalic, tms dull, nasal mucosa red and swollen, throat injected, neck supple no lymph, lungs c t a, cv rrr, neuro intact; q strep neg, flu swab neg  A:  Acute uri   P: omnicef, drink fluids, continue regular meds , use otc meds of choice, return if not improving in 5 days, return earlier if worsening

## 2016-02-01 ENCOUNTER — Other Ambulatory Visit: Payer: Self-pay | Admitting: Family

## 2016-02-01 ENCOUNTER — Telehealth: Payer: Self-pay | Admitting: Family

## 2016-02-01 MED ORDER — FLUCONAZOLE 150 MG PO TABS: ORAL_TABLET | ORAL | Status: DC

## 2016-02-01 NOTE — Telephone Encounter (Signed)
Will send diflucan  . [place] FNP

## 2016-02-01 NOTE — Progress Notes (Signed)
Yeast sxs from omnicef . rx diflucan sent to [facility] [place] .Gerty FNP

## 2016-02-02 NOTE — Telephone Encounter (Signed)
Patient was contacted and informed that medication was e-scripted to pharmacy [facility]([address])

## 2016-03-07 DIAGNOSIS — Z30011 Encounter for initial prescription of contraceptive pills: Secondary | ICD-10-CM | POA: Diagnosis not present

## 2016-03-07 DIAGNOSIS — N390 Urinary tract infection, site not specified: Secondary | ICD-10-CM | POA: Diagnosis not present

## 2016-03-07 DIAGNOSIS — Z30432 Encounter for removal of intrauterine contraceptive device: Secondary | ICD-10-CM | POA: Diagnosis not present

## 2016-03-14 DIAGNOSIS — J029 Acute pharyngitis, unspecified: Secondary | ICD-10-CM | POA: Diagnosis not present

## 2016-03-14 DIAGNOSIS — J020 Streptococcal pharyngitis: Secondary | ICD-10-CM | POA: Diagnosis not present

## 2016-04-26 ENCOUNTER — Ambulatory Visit
Admission: RE | Admit: 2016-04-26 | Discharge: 2016-04-26 | Disposition: A | Discharge status: Home / Self Care | Payer: Self-pay | Source: Ambulatory Visit | Attending: Family Medicine | Admitting: Family Medicine

## 2016-04-26 ENCOUNTER — Ambulatory Visit (INDEPENDENT_AMBULATORY_CARE_PROVIDER_SITE_OTHER): Payer: 59 | Admitting: Family Medicine

## 2016-04-26 ENCOUNTER — Encounter: Payer: Self-pay | Admitting: Family Medicine

## 2016-04-26 VITALS — BP 118/62 | HR 105 | Temp 97.9°F | Resp 20 | Ht 69.0 in | Wt 168.9 lb

## 2016-04-26 DIAGNOSIS — R0602 Shortness of breath: Secondary | ICD-10-CM | POA: Diagnosis not present

## 2016-04-26 DIAGNOSIS — J069 Acute upper respiratory infection, unspecified: Secondary | ICD-10-CM | POA: Diagnosis not present

## 2016-04-26 DIAGNOSIS — R0781 Pleurodynia: Secondary | ICD-10-CM | POA: Insufficient documentation

## 2016-04-26 DIAGNOSIS — R042 Hemoptysis: Secondary | ICD-10-CM

## 2016-04-26 DIAGNOSIS — J45901 Unspecified asthma with (acute) exacerbation: Secondary | ICD-10-CM | POA: Diagnosis not present

## 2016-04-26 DIAGNOSIS — M25512 Pain in left shoulder: Secondary | ICD-10-CM | POA: Diagnosis not present

## 2016-04-26 DIAGNOSIS — R918 Other nonspecific abnormal finding of lung field: Secondary | ICD-10-CM | POA: Insufficient documentation

## 2016-04-26 IMAGING — CR DG CHEST 2V
1 series · 2 of 2 positions shown · non-contrast
Comparison: [DATE]

CLINICAL DATA: Pleuritic pain and shortness of breath with
hemoptysis 3 days.

EXAM:
CHEST  2 VIEW

[Series 1: dg chest 2 view · 0.14mm/px · 2 of 2 slices shown]
[im 1/2]
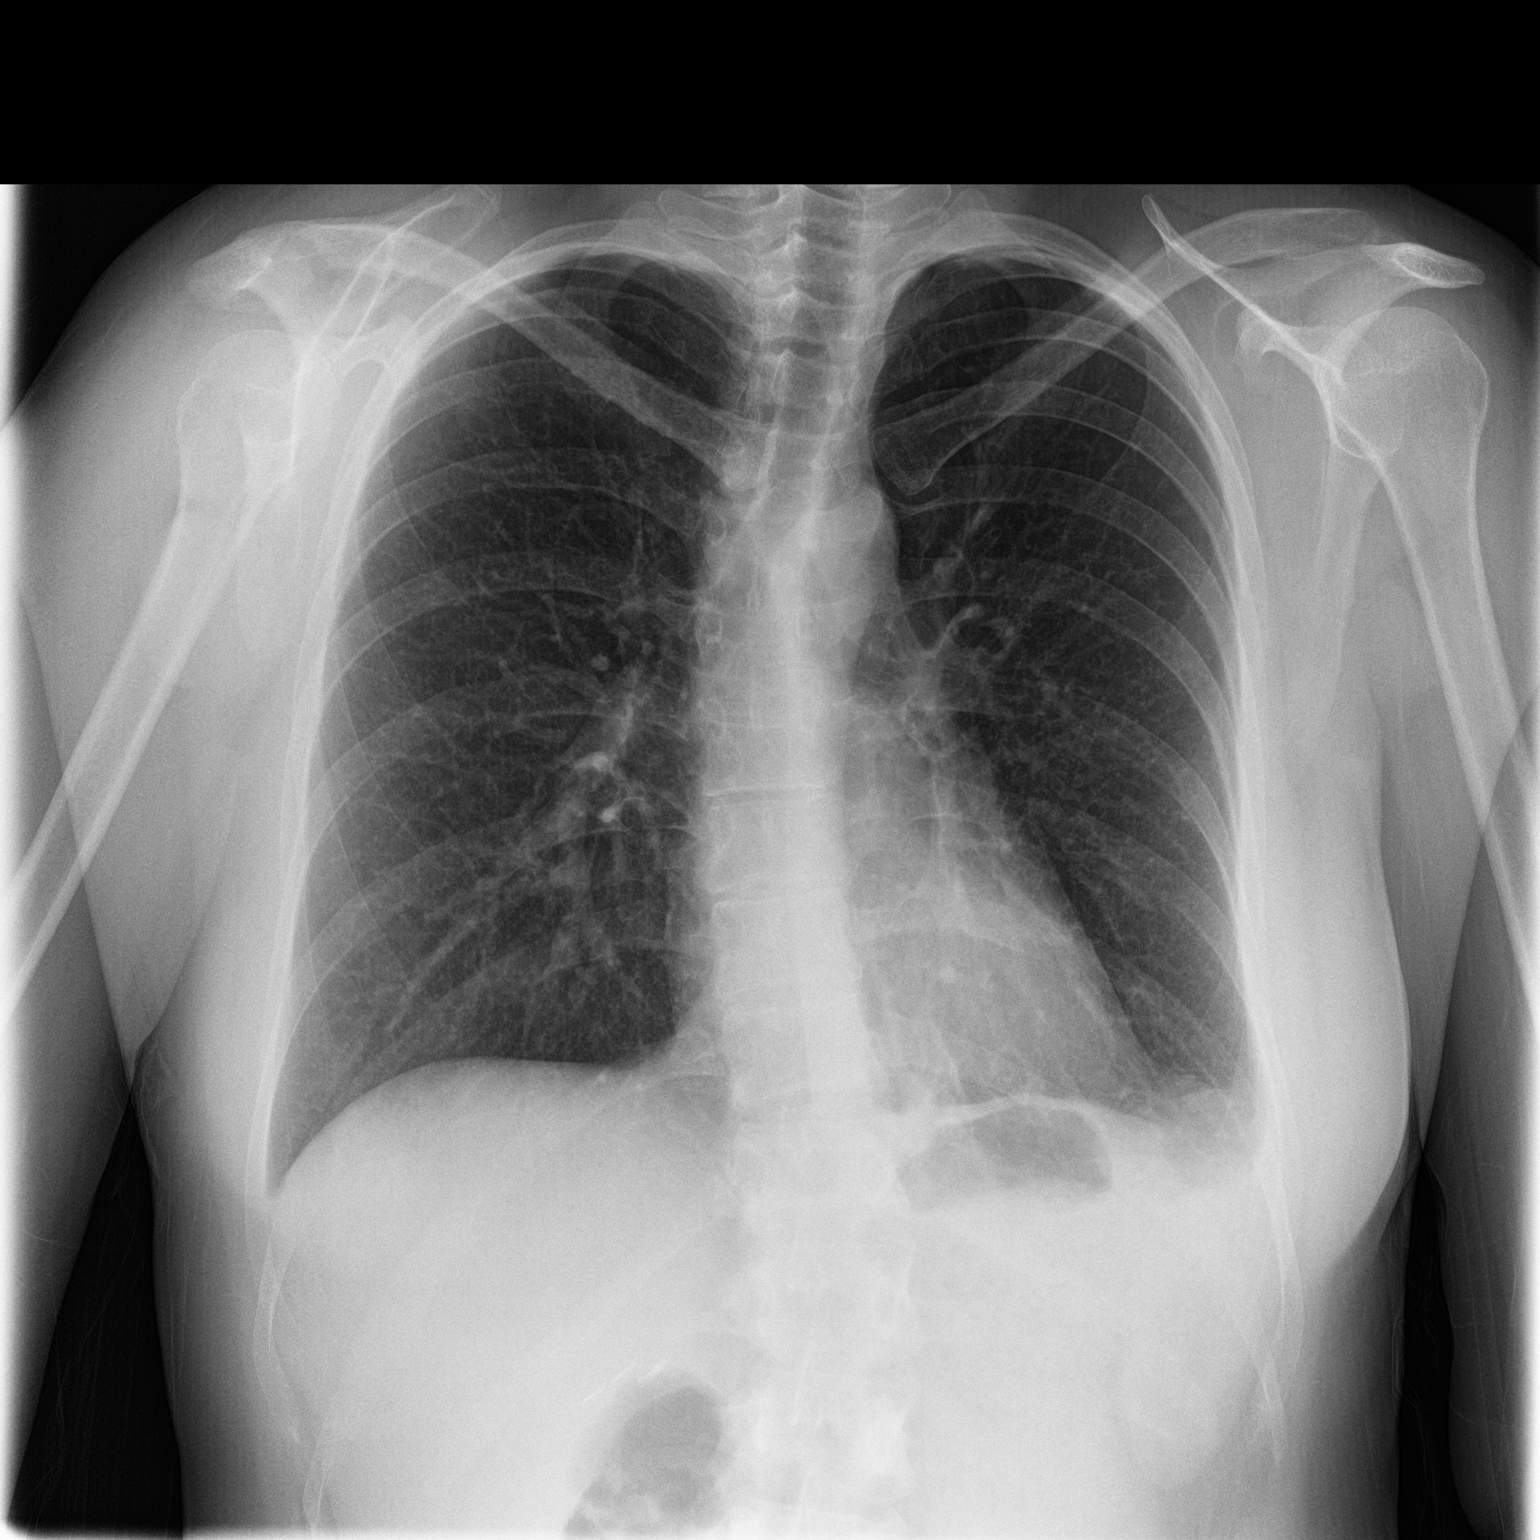
[im 2/2]
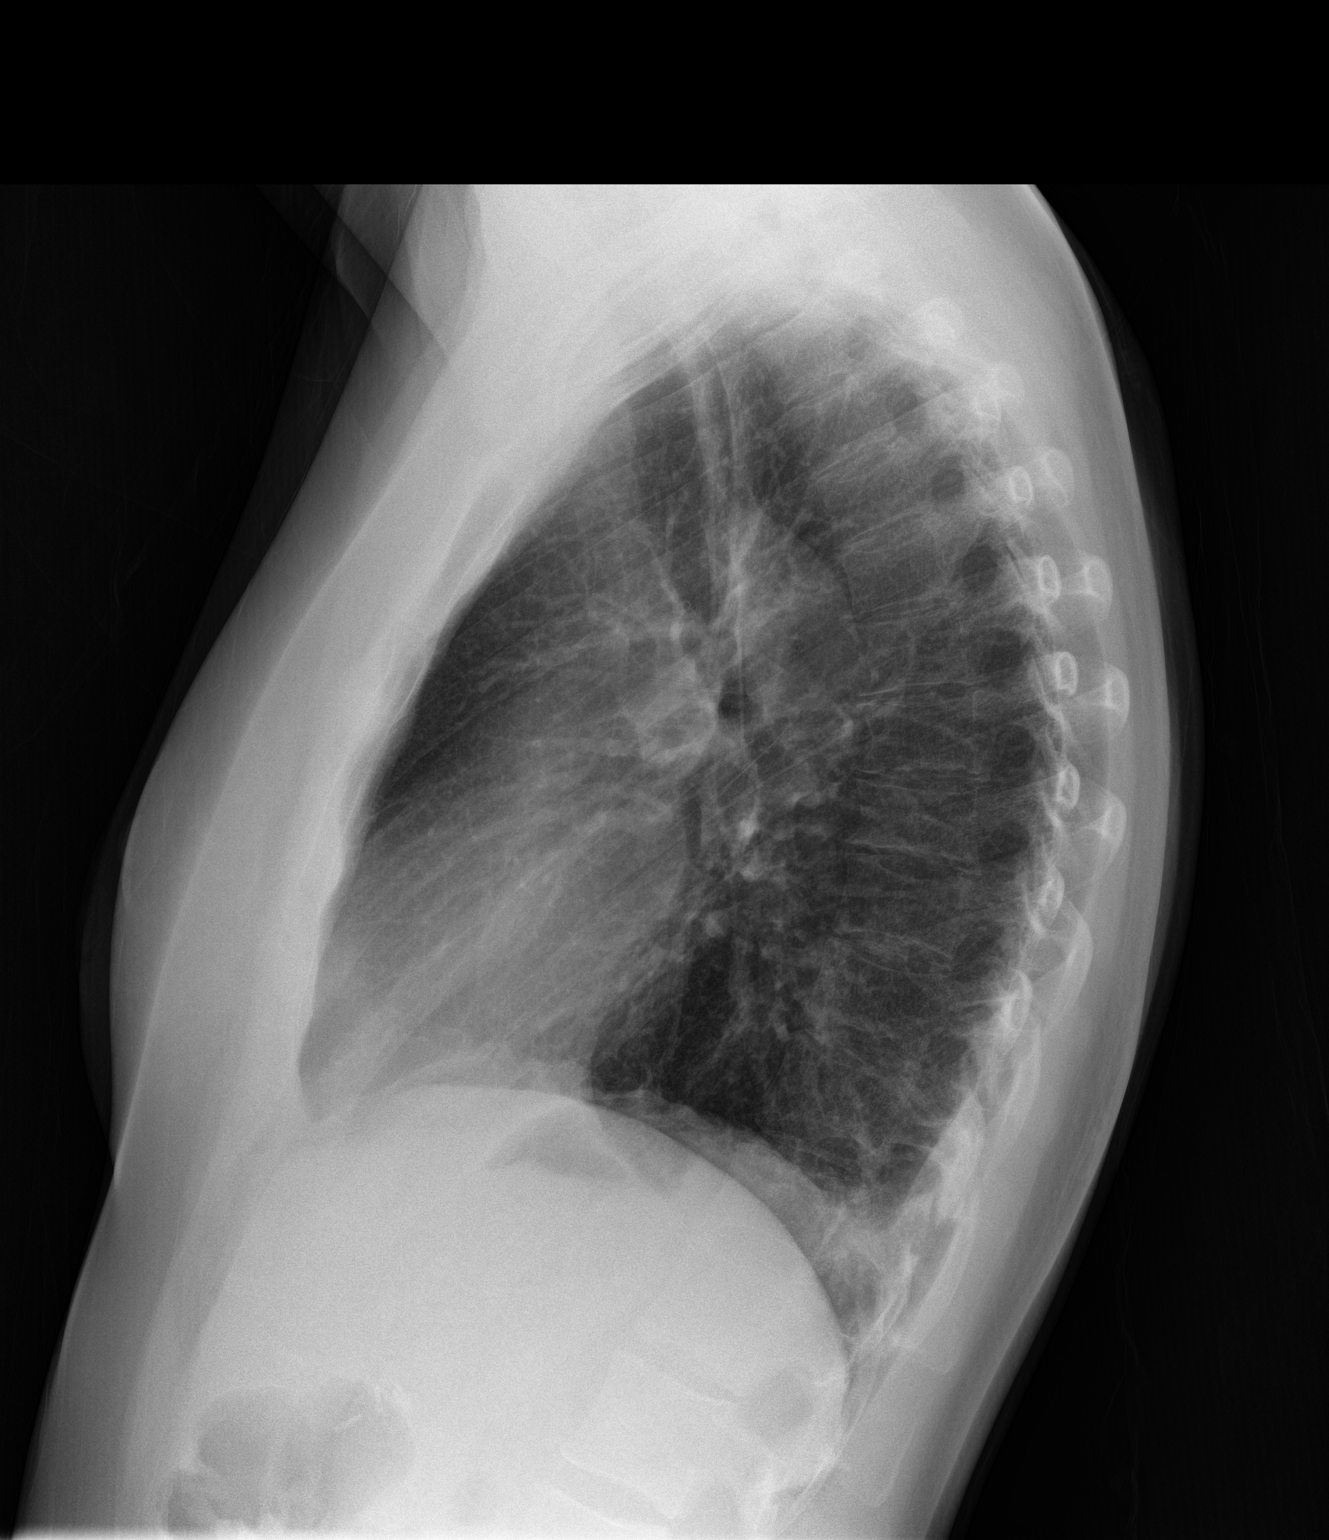

[2 of 2 positions shown; findings below may reference images not displayed]

FINDINGS: Lungs are adequately inflated with mild bibasilar opacification
which may be due to atelectasis or early infection. Possible small
amount of left pleural fluid. Cardiomediastinal silhouette is within
normal. There are minimal degenerative changes of the spine with
mild biphasic curvature of the thoracolumbar spine.
IMPRESSION: Subtle left base opacification which may be due to atelectasis or
infection. Possible small amount of left pleural fluid.

## 2016-04-26 MED ORDER — FLUTICASONE FUROATE-VILANTEROL 100-25 MCG/INH IN AEPB
1.0000 | INHALATION_SPRAY | Freq: Every day | RESPIRATORY_TRACT | Status: DC
Start: 2016-04-26 — End: 2016-09-12

## 2016-04-26 MED ORDER — AZITHROMYCIN 250 MG PO TABS: ORAL_TABLET | ORAL | Status: DC

## 2016-04-26 MED ORDER — PREDNISONE 10 MG PO TABS: 10.0000 mg | ORAL_TABLET | Freq: Three times a day (TID) | ORAL | Status: DC

## 2016-04-26 NOTE — Progress Notes (Signed)
Name: Ashley Patel   MRN: [id number]    DOB: 1981/03/18   Date:04/26/2016       Progress Note  Subjective  Chief Complaint  Chief Complaint  Patient presents with  . URI    Onset-2 weeks, very fatigue, SOB, muscles achiness, when breathing in her bilateral-bottom of her lungs it hurts, coughing up bloody mucus. Patient symptoms were easing up but has came back-at first having a lot of nasal mucus and production, coughing. Patient has been taking Tylenol for symptom relief.  . Asthma    HPI  URI/Asthma exacerbation/SOB: patient states symptoms started with URI symptoms 2 weeks ago , she felt slightly better but over the past week she has noticed SOB, pleuritic chest pain, denies Thiswheezing or orthopnea. Sh e is feeling very tired, no fever. This  morning she had an episode of streaks of blood over her sputum and decided to come in to be evaluated.     Left shoulder pain: over the past couple of days, keeping her up at night, not aggravated by touch and has normal rom of shoulder  Patient Active Problem List   Diagnosis Date Noted  . Panic attack 08/11/2015  . B12 deficiency 08/05/2015  . Acne 08/05/2015  . Blood glucose elevated 08/05/2015  . IBS (irritable bowel syndrome) 08/05/2015  . Numerous moles 08/05/2015  . Vitamin D deficiency 08/05/2015  . Family history of thyroid disease 08/05/2015  . Asthma without status asthmaticus 06/16/2010  . Anxiety and depression 06/16/2010    Past Surgical History  Procedure Laterality Date  . Tonsillectomy and adenoidectomy    . Cholecystectomy      Family History  Problem Relation Age of Onset  . Cardiomyopathy Mother   . Macrocephaly Son     Social History   Social History  . Marital Status: Married    Spouse Name: N/A  . Number of Children: N/A  . Years of Education: N/A   Occupational History  . Not on file.   Social History Main Topics  . Smoking status: Former Smoker -- 0.50 packs/day for 5 years    Types:  Cigarettes    Start date: 12/04/2003    Quit date: 11/04/2009  . Smokeless tobacco: Never Used  . Alcohol Use: 0.0 oz/week    0 Standard drinks or equivalent per week     Comment: occasionally  . Drug Use: No  . Sexual Activity:    Partners: Male    Birth Control/ Protection: Implant   Other Topics Concern  . Not on file   Social History Narrative     Current outpatient prescriptions:  .  cyanocobalamin 100 MCG tablet, Place under the tongue., Disp: , Rfl:  .  Levonorgestrel-Ethinyl Estradiol (SEASONIQUE) 0.15-0.03 &0.01 MG tablet, Take by mouth., Disp: , Rfl:  .  azithromycin (ZITHROMAX) 250 MG tablet, Take as directed, Disp: 6 tablet, Rfl: 0 .  fluticasone furoate-vilanterol (BREO ELLIPTA) 100-25 MCG/INH AEPB, Inhale 1 puff into the lungs daily., Disp: 60 each, Rfl: 0 .  predniSONE (DELTASONE) 10 MG tablet, Take 1 tablet (10 mg total) by mouth 3 (three) times daily., Disp: 15 tablet, Rfl: 0  Allergies  Allergen Reactions  . Cymbalta [Duloxetine Hcl]     Abdominal Pain     ROS  Constitutional: Negative for fever or weight change.  Respiratory: Positive  for cough and shortness of breath.   Cardiovascular: Negative for chest pain or palpitations.  Gastrointestinal: Negative for abdominal pain, no bowel changes.  Musculoskeletal: Negative  for gait problem or joint swelling.  Skin: Negative for rash.  Neurological: Negative for dizziness or headache.  No other specific complaints in a complete review of systems (except as listed in HPI above).  Objective  Filed Vitals:   04/26/16 1505  BP: 118/62  Pulse: 105  Temp: 97.9 F (36.6 C)  TempSrc: Oral  Resp: 20  Height: 5\' 9"  (1.753 m)  Weight: 168 lb 14.4 oz (76.613 kg)  SpO2: 97%    Body mass index is 24.93 kg/(m^2).  Physical Exam  Constitutional: Patient appears well-developed and well-nourished.  No distress.  HEENT: head atraumatic, normocephalic, pupils equal and reactive to light, ears normal TM  bilaterally, neck supple, throat within normal limits Cardiovascular: Normal rate, regular rhythm and normal heart sounds.  No murmur heard. No BLE edema. Pulmonary/Chest: Effort normal and breath sounds normal. No respiratory distress. Abdominal: Soft.  There is no tenderness. Psychiatric: Patient has a normal mood and affect. behavior is normal. Judgment and thought content normal. Muscular Skeletal: normal shoulder exam  PHQ2/9: Depression screen [facility] 2/9 04/26/2016 08/11/2015  Decreased Interest 0 0  Down, Depressed, Hopeless 0 0  PHQ - 2 Score 0 0     Fall Risk: Fall Risk  04/26/2016 08/11/2015  Falls in the past year? No No     Functional Status Survey: Is the patient deaf or have difficulty hearing?: No Does the patient have difficulty seeing, even when wearing glasses/contacts?: No Does the patient have difficulty concentrating, remembering, or making decisions?: No Does the patient have difficulty walking or climbing stairs?: No Does the patient have difficulty dressing or bathing?: No Does the patient have difficulty doing errands alone such as visiting a doctor's office or shopping?: No    Assessment & Plan  1. Asthma exacerbation  - fluticasone furoate-vilanterol (BREO ELLIPTA) 100-25 MCG/INH AEPB; Inhale 1 puff into the lungs daily.  Dispense: 60 each; Refill: 0  2. Upper respiratory infection  Doing better  3. Pleuritic chest pain  - DG Chest 2 View; Future - azithromycin (ZITHROMAX) 250 MG tablet; Take as directed  Dispense: 6 tablet; Refill: 0 - predniSONE (DELTASONE) 10 MG tablet; Take 1 tablet (10 mg total) by mouth 3 (three) times daily.  Dispense: 15 tablet; Refill: 0 - Comprehensive metabolic panel  4. SOB (shortness of breath)  - DG Chest 2 View; Future - azithromycin (ZITHROMAX) 250 MG tablet; Take as directed  Dispense: 6 tablet; Refill: 0 - predniSONE (DELTASONE) 10 MG tablet; Take 1 tablet (10 mg total) by mouth 3 (three) times daily.  Dispense:  15 tablet; Refill: 0 - CBC with Differential/Platelet - Comprehensive metabolic panel  5. Left shoulder pain  It may be from pleural inflammation - referred pain versus an unrelated problem, normal shoulder exam - CBC with Differential/Platelet - Comprehensive metabolic panel  6. Cough with hemoptysis  - Quantiferon tb gold assay (blood)

## 2016-04-27 ENCOUNTER — Telehealth: Payer: Self-pay

## 2016-05-01 ENCOUNTER — Ambulatory Visit: Payer: 59 | Admitting: Family Medicine

## 2016-05-02 LAB — CBC WITH DIFFERENTIAL/PLATELET
BASOS ABS: 0 10*3/uL (ref 0.0–0.2)
Basos: 0 %
EOS (ABSOLUTE): 0.1 10*3/uL (ref 0.0–0.4)
Eos: 1 %
Hematocrit: 38 % (ref 34.0–46.6)
Hemoglobin: 12.8 g/dL (ref 11.1–15.9)
Immature Grans (Abs): 0 10*3/uL (ref 0.0–0.1)
Immature Granulocytes: 0 %
LYMPHS ABS: 2.5 10*3/uL (ref 0.7–3.1)
Lymphs: 30 %
MCH: 27.9 pg (ref 26.6–33.0)
MCHC: 33.7 g/dL (ref 31.5–35.7)
MCV: 83 fL (ref 79–97)
MONOS ABS: 0.9 10*3/uL (ref 0.1–0.9)
Monocytes: 10 %
Neutrophils Absolute: 5 10*3/uL (ref 1.4–7.0)
Neutrophils: 59 %
Platelets: 359 10*3/uL (ref 150–379)
RBC: 4.59 x10E6/uL (ref 3.77–5.28)
RDW: 13.5 % (ref 12.3–15.4)
WBC: 8.6 10*3/uL (ref 3.4–10.8)

## 2016-05-02 LAB — QUANTIFERON IN TUBE
QFT TB AG MINUS NIL VALUE: 0.12 [IU]/mL
QUANTIFERON TB AG VALUE: 0.21 IU/mL
QUANTIFERON TB GOLD: NEGATIVE
Quantiferon Nil Value: 0.09 IU/mL

## 2016-05-02 LAB — COMPREHENSIVE METABOLIC PANEL
ALK PHOS: 91 IU/L (ref 39–117)
ALT: 19 IU/L (ref 0–32)
AST: 17 IU/L (ref 0–40)
Albumin/Globulin Ratio: 1.4 (ref 1.2–2.2)
Albumin: 4.1 g/dL (ref 3.5–5.5)
BUN/Creatinine Ratio: 15 (ref 9–23)
BUN: 12 mg/dL (ref 6–20)
Bilirubin Total: 0.3 mg/dL (ref 0.0–1.2)
CHLORIDE: 99 mmol/L (ref 96–106)
CO2: 22 mmol/L (ref 18–29)
CREATININE: 0.78 mg/dL (ref 0.57–1.00)
Calcium: 9 mg/dL (ref 8.7–10.2)
GFR calc non Af Amer: 100 mL/min/{1.73_m2} (ref >59)
GFR, EST AFRICAN AMERICAN: 116 mL/min/{1.73_m2} (ref >59)
GLUCOSE: 97 mg/dL (ref 65–99)
Globulin, Total: 2.9 g/dL (ref 1.5–4.5)
POTASSIUM: 4.8 mmol/L (ref 3.5–5.2)
SODIUM: 138 mmol/L (ref 134–144)
Total Protein: 7 g/dL (ref 6.0–8.5)

## 2016-05-02 LAB — QUANTIFERON TB GOLD ASSAY (BLOOD)

## 2016-05-09 NOTE — Telephone Encounter (Signed)
Erroneous entry

## 2016-06-11 ENCOUNTER — Ambulatory Visit (INDEPENDENT_AMBULATORY_CARE_PROVIDER_SITE_OTHER): Payer: 59 | Admitting: Family Medicine

## 2016-06-11 ENCOUNTER — Encounter: Payer: Self-pay | Admitting: Family Medicine

## 2016-06-11 VITALS — BP 116/78 | HR 110 | Temp 97.4°F | Resp 18 | Ht 69.0 in | Wt 169.3 lb

## 2016-06-11 DIAGNOSIS — R5383 Other fatigue: Secondary | ICD-10-CM

## 2016-06-11 DIAGNOSIS — M5134 Other intervertebral disc degeneration, thoracic region: Secondary | ICD-10-CM

## 2016-06-11 DIAGNOSIS — J189 Pneumonia, unspecified organism: Secondary | ICD-10-CM

## 2016-06-11 DIAGNOSIS — M4124 Other idiopathic scoliosis, thoracic region: Secondary | ICD-10-CM | POA: Diagnosis not present

## 2016-06-11 DIAGNOSIS — M419 Scoliosis, unspecified: Secondary | ICD-10-CM

## 2016-06-11 NOTE — Patient Instructions (Signed)

## 2016-06-11 NOTE — Progress Notes (Signed)
Name: Lonisha R Seifer   MRN: [id number]    DOB: 1981/10/24   Date:06/11/2016       Progress Note  Subjective  Chief Complaint  Chief Complaint  Patient presents with  . Follow-up    patient is here following up on her recent imaging results  . Referral  . Labs Only    HPI  CAP: she was seen in May 2017 with pleural effusion, she took antibiotics and no longer has cough, SOB or left shoulder pain. She is here today to repeat CXR  Fatigue: she has noticed severe fatigue over the past 6 months, notices that she needs to go to bed around 8 pm instead of 11 pm. 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  She denies sadness, but feels tired and overwhelmed. No marital problems.   DDD spine: she is very concerned about it, wanted to wear a brace, but advised to do strengthening exercises, she denies pain or muscle spasm  Asthma: no SOB, wheezing or cough.   Patient Active Problem List   Diagnosis Date Noted  . Panic attack 08/11/2015  . B12 deficiency 08/05/2015  . Acne 08/05/2015  . Blood glucose elevated 08/05/2015  . IBS (irritable bowel syndrome) 08/05/2015  . Numerous moles 08/05/2015  . Vitamin D deficiency 08/05/2015  . Family history of thyroid disease 08/05/2015  . Asthma without status asthmaticus 06/16/2010  . Anxiety and depression 06/16/2010    Past Surgical History  Procedure Laterality Date  . Tonsillectomy and adenoidectomy    . Cholecystectomy      Family History  Problem Relation Age of Onset  . Cardiomyopathy Mother   . Macrocephaly Son     Social History   Social History  . Marital Status: Married    Spouse Name: N/A  . Number of Children: N/A  . Years of Education: N/A   Occupational History  . Not on file.   Social History Main Topics  . Smoking status: Former Smoker -- 0.50 packs/day for 5 years    Types: Cigarettes    Start  date: 12/04/2003    Quit date: 11/04/2009  . Smokeless tobacco: Never Used  . Alcohol Use: 0.0 oz/week    0 Standard drinks or equivalent per week     Comment: occasionally  . Drug Use: No  . Sexual Activity:    Partners: Male    Birth Control/ Protection: Implant   Other Topics Concern  . Not on file   Social History Narrative     Current outpatient prescriptions:  .  cyanocobalamin 100 MCG tablet, Place under the tongue., Disp: , Rfl:  .  Levonorgestrel-Ethinyl Estradiol (SEASONIQUE) 0.15-0.03 &0.01 MG tablet, Take by mouth., Disp: , Rfl:  .  fluticasone furoate-vilanterol (BREO ELLIPTA) 100-25 MCG/INH AEPB, Inhale 1 puff into the lungs daily. (Patient not taking: Reported on 06/11/2016), Disp: 60 each, Rfl: 0  Allergies  Allergen Reactions  . Cymbalta [Duloxetine Hcl]     Abdominal Pain     ROS  Ten systems reviewed and is negative except as mentioned in HPI   Objective  Filed Vitals:   06/11/16 0918  BP: 116/78  Pulse: 110  Temp: 97.4 F (36.3 C)  TempSrc: Oral  Resp: 18  Height: 5\' 9"  (1.753 m)  Weight: 169 lb 4.8 oz (76.794 kg)  SpO2: 97%    Body mass  index is 24.99 kg/(m^2).  Physical Exam  Constitutional: Patient appears well-developed and well-nourished. Obese No distress.  HEENT: head atraumatic, normocephalic, pupils equal and reactive to light,  neck supple, throat within normal limits Cardiovascular: Normal rate, regular rhythm and normal heart sounds.  No murmur heard. No BLE edema. Pulmonary/Chest: Effort normal and breath sounds normal. No respiratory distress. Abdominal: Soft.  There is no tenderness. Psychiatric: Patient has a normal mood and affect. behavior is normal. Judgment and thought content normal.  Recent Results (from the past 2160 hour(s))  CBC with Differential/Platelet     Status: None   Collection Time: 04/26/16  4:20 PM  Result Value Ref Range   WBC 8.6 3.4 - 10.8 x10E3/uL   RBC 4.59 3.77 - 5.28 x10E6/uL   Hemoglobin  12.8 11.1 - 15.9 g/dL   Hematocrit 38.0 34.0 - 46.6 %   MCV 83 79 - 97 fL   MCH 27.9 26.6 - 33.0 pg   MCHC 33.7 31.5 - 35.7 g/dL   RDW 13.5 12.3 - 15.4 %   Platelets 359 150 - 379 x10E3/uL   Neutrophils 59 %   Lymphs 30 %   Monocytes 10 %   Eos 1 %   Basos 0 %   Neutrophils Absolute 5.0 1.4 - 7.0 x10E3/uL   Lymphocytes Absolute 2.5 0.7 - 3.1 x10E3/uL   Monocytes Absolute 0.9 0.1 - 0.9 x10E3/uL   EOS (ABSOLUTE) 0.1 0.0 - 0.4 x10E3/uL   Basophils Absolute 0.0 0.0 - 0.2 x10E3/uL   Immature Granulocytes 0 %   Immature Grans (Abs) 0.0 0.0 - 0.1 x10E3/uL  Comprehensive metabolic panel     Status: None   Collection Time: 04/26/16  4:20 PM  Result Value Ref Range   Glucose 97 65 - 99 mg/dL   BUN 12 6 - 20 mg/dL   Creatinine, Ser 0.78 0.57 - 1.00 mg/dL   GFR calc non Af Amer 100 >59 mL/min/1.73   GFR calc Af Amer 116 >59 mL/min/1.73   BUN/Creatinine Ratio 15 9 - 23   Sodium 138 134 - 144 mmol/L   Potassium 4.8 3.5 - 5.2 mmol/L   Chloride 99 96 - 106 mmol/L   CO2 22 18 - 29 mmol/L   Calcium 9.0 8.7 - 10.2 mg/dL   Total Protein 7.0 6.0 - 8.5 g/dL   Albumin 4.1 3.5 - 5.5 g/dL   Globulin, Total 2.9 1.5 - 4.5 g/dL   Albumin/Globulin Ratio 1.4 1.2 - 2.2   Bilirubin Total 0.3 0.0 - 1.2 mg/dL   Alkaline Phosphatase 91 39 - 117 IU/L   AST 17 0 - 40 IU/L   ALT 19 0 - 32 IU/L  Quantiferon tb gold assay (blood)     Status: None   Collection Time: 04/26/16  4:20 PM  Result Value Ref Range   QUANTIFERON INCUBATION Comment     Comment: Specimen incubated at [facility], [place], [place].  QuantiFERON In Tube     Status: None   Collection Time: 04/26/16  4:20 PM  Result Value Ref Range   QUANTIFERON TB GOLD Negative Negative   QUANTIFERON CRITERIA Comment     Comment: To be considered positive a specimen should have a TB Ag minus Nil value greater than or equal to 0.35 IU/mL and in addition the TB Ag minus Nil value must be greater than or equal to 25% of the Nil value. There may be  insufficient information in these values to differentiate between some negative and some indeterminate test values.  QUANTIFERON TB AG VALUE 0.21 IU/mL   Quantiferon Nil Value 0.09 IU/mL   QUANTIFERON MITOGEN VALUE >10.00 IU/mL   QFT TB AG MINUS NIL VALUE 0.12 IU/mL   Interpretation: Comment     Comment: The QuantiFERON TB Gold (in Tube) assay is intended for use as an aid in the diagnosis of TB infection. Negative results suggest that there is no TB infection. In patients with high suspicion of exposure, a negative test should be repeated. A positive test indicates infection with Mycobacterium tuberculosis. Among individuals without tuberculosis infection, a positive test may be due to exposure to [place], M. szulgai or M. marinum. On the Internet, go to https://figueroa-lambert.info/ for further details.      PHQ2/9: Depression screen [facility] 2/9 06/11/2016 04/26/2016 08/11/2015  Decreased Interest 0 0 0  Down, Depressed, Hopeless 0 0 0  PHQ - 2 Score 0 0 0    Fall Risk: Fall Risk  06/11/2016 04/26/2016 08/11/2015  Falls in the past year? No No No    Functional Status Survey: Is the patient deaf or have difficulty hearing?: No Does the patient have difficulty seeing, even when wearing glasses/contacts?: No Does the patient have difficulty concentrating, remembering, or making decisions?: No Does the patient have difficulty walking or climbing stairs?: No Does the patient have difficulty dressing or bathing?: No Does the patient have difficulty doing errands alone such as visiting a doctor's office or shopping?: No    Assessment & Plan  1. CAP (community acquired pneumonia)  - DG Chest 2 View; Future  2. DDD (degenerative disc disease), thoracic  Core exercises  3. Scoliosis of thoracic spine  Doing well   4. Other fatigue  - Sedimentation rate - TSH - Vitamin B12 - VITAMIN D 25 Hydroxy (Vit-D Deficiency, Fractures) - CBC with Differential/Platelet - Comprehensive metabolic  panel

## 2016-08-22 ENCOUNTER — Encounter: Payer: 59 | Admitting: Family Medicine

## 2016-09-12 ENCOUNTER — Encounter: Payer: Self-pay | Admitting: Family Medicine

## 2016-09-12 ENCOUNTER — Ambulatory Visit (INDEPENDENT_AMBULATORY_CARE_PROVIDER_SITE_OTHER): Payer: 59 | Admitting: Family Medicine

## 2016-09-12 VITALS — BP 118/78 | HR 87 | Temp 99.0°F | Resp 16 | Ht 69.0 in | Wt 174.1 lb

## 2016-09-12 DIAGNOSIS — Z01419 Encounter for gynecological examination (general) (routine) without abnormal findings: Secondary | ICD-10-CM | POA: Diagnosis not present

## 2016-09-12 DIAGNOSIS — F330 Major depressive disorder, recurrent, mild: Secondary | ICD-10-CM

## 2016-09-12 DIAGNOSIS — E538 Deficiency of other specified B group vitamins: Secondary | ICD-10-CM | POA: Diagnosis not present

## 2016-09-12 DIAGNOSIS — Z13220 Encounter for screening for lipoid disorders: Secondary | ICD-10-CM | POA: Diagnosis not present

## 2016-09-12 DIAGNOSIS — F411 Generalized anxiety disorder: Secondary | ICD-10-CM | POA: Diagnosis not present

## 2016-09-12 DIAGNOSIS — Z131 Encounter for screening for diabetes mellitus: Secondary | ICD-10-CM | POA: Diagnosis not present

## 2016-09-12 DIAGNOSIS — E559 Vitamin D deficiency, unspecified: Secondary | ICD-10-CM

## 2016-09-12 DIAGNOSIS — Z124 Encounter for screening for malignant neoplasm of cervix: Secondary | ICD-10-CM | POA: Diagnosis not present

## 2016-09-12 DIAGNOSIS — Z8349 Family history of other endocrine, nutritional and metabolic diseases: Secondary | ICD-10-CM

## 2016-09-12 MED ORDER — DULOXETINE HCL 30 MG PO CPEP: 30.0000 mg | ORAL_CAPSULE | Freq: Every day | ORAL | 0 refills | Status: DC

## 2016-09-12 NOTE — Progress Notes (Signed)
Name: Ashley Patel   MRN: [id number]    DOB: [birth year]   Date:09/12/2016       Progress Note  Subjective  Chief Complaint  Chief Complaint  Patient presents with  . Annual Exam    HPI  Well Woman: she is married and sexually active, no discomfort with intercourse or bladder symptoms.   Major Depression Mild: she has a long history of depression, she was started on medication as a teenager and has seen a therapist. She states mother and grandmother also have similar symptoms. She was doing well, however would like to resume medication - responded well to Cymbalta. She has been feeling tired all the time, she anhedonia, no crying spells.   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 She is second guessing if she chose the right career and trying to go back to [employer] and work in the kitchen ( Veterinary surgeon ), she is waking up at night worried about work.  B12 deficiency and Vitamin D deficiency: she has not been very consistent taking it.  Patient Active Problem List   Diagnosis Date Noted  . Panic attack 08/11/2015  . B12 deficiency 08/05/2015  . Acne 08/05/2015  . Blood glucose elevated 08/05/2015  . IBS (irritable bowel syndrome) 08/05/2015  . Numerous moles 08/05/2015  . Vitamin D deficiency 08/05/2015  . Family history of thyroid disease 08/05/2015  . Asthma without status asthmaticus 06/16/2010  . Anxiety and depression 06/16/2010    Past Surgical History:  Procedure Laterality Date  . CHOLECYSTECTOMY    . TONSILLECTOMY AND ADENOIDECTOMY      Family History  Problem Relation Age of Onset  . Cardiomyopathy Mother   . Macrocephaly Son     Social History   Social History  . Marital status: Married    Spouse name: N/A  . Number of children: N/A  . Years of education: N/A   Occupational History  . Not on file.   Social  History Main Topics  . Smoking status: Former Smoker    Packs/day: 0.50    Years: 5.00    Types: Cigarettes    Start date: 12/04/2003    Quit date: 11/04/2009  . Smokeless tobacco: Never Used  . Alcohol use 0.0 oz/week     Comment: occasionally  . Drug use: No  . Sexual activity: Yes    Partners: Male    Birth control/ protection: Implant   Other Topics Concern  . Not on file   Social History Narrative  . No narrative on file     Current Outpatient Prescriptions:  .  cyanocobalamin 100 MCG tablet, Place under the tongue., Disp: , Rfl:  .  DULoxetine (CYMBALTA) 30 MG capsule, Take 1-2 capsules (30-60 mg total) by mouth daily., Disp: 60 capsule, Rfl: 0 .  Levonorgestrel-Ethinyl Estradiol (SEASONIQUE) 0.15-0.03 &0.01 MG tablet, Take by mouth., Disp: , Rfl:   Allergies  Allergen Reactions  . Cymbalta [Duloxetine Hcl]     Abdominal Pain     ROS  Constitutional: Negative for fever or weight change.  Respiratory: Negative for cough and shortness of breath.   Cardiovascular: Negative for chest pain or palpitations.  Gastrointestinal: Negative for abdominal pain, no bowel changes.  Musculoskeletal: Negative for gait problem or joint swelling.  Skin: Negative for rash.  Neurological: Negative for dizziness or headache.  No other specific complaints in a complete review of systems (except as listed in HPI above).  Objective  Vitals:   09/12/16 1114  BP: 118/78  Pulse: 87  Resp: 16  Temp: 99 F (37.2 C)  SpO2: 99%  Weight: 174 lb 2 oz (79 kg)  Height: 5\' 9"  (1.753 m)    Body mass index is 25.71 kg/m.  Physical Exam  Constitutional: Patient appears well-developed and well-nourished. No distress.  HENT: Head: Normocephalic and atraumatic. Ears: B TMs ok, no erythema or effusion; Nose: Nose normal. Mouth/Throat: Oropharynx is clear and moist. No oropharyngeal exudate.  Eyes: Conjunctivae and EOM are normal. Pupils are equal, round, and reactive to light. No scleral  icterus.  Neck: Normal range of motion. Neck supple. No JVD present. No thyromegaly present.  Cardiovascular: Normal rate, regular rhythm and normal heart sounds.  No murmur heard. No BLE edema. Pulmonary/Chest: Effort normal and breath sounds normal. No respiratory distress. Abdominal: Soft. Bowel sounds are normal, no distension. There is no tenderness. no masses Breast: no lumps or masses, no nipple discharge or rashes FEMALE GENITALIA:  External genitalia normal External urethra normal Pelvic not done RECTAL: no rectal masses or hemorrhoids Musculoskeletal: Normal range of motion, no joint effusions. No gross deformities Neurological: he is alert and oriented to person, place, and time. No cranial nerve deficit. Coordination, balance, strength, speech and gait are normal.  Skin: Skin is warm and dry. No rash noted. No erythema.  Psychiatric: Patient has a normal mood and affect. behavior is normal. Judgment and thought content normal.  PHQ2/9: Depression screen [facility] 2/9 09/12/2016 06/11/2016 04/26/2016 08/11/2015  Decreased Interest 2 0 0 0  Down, Depressed, Hopeless 2 0 0 0  PHQ - 2 Score 4 0 0 0  Altered sleeping 2 - - -  Tired, decreased energy 1 - - -  Change in appetite 1 - - -  Feeling bad or failure about yourself  0 - - -  Trouble concentrating 0 - - -  Moving slowly or fidgety/restless 0 - - -  Suicidal thoughts 0 - - -  PHQ-9 Score 8 - - -  Difficult doing work/chores Somewhat difficult - - -     Fall Risk: Fall Risk  09/12/2016 06/11/2016 04/26/2016 08/11/2015  Falls in the past year? No No No No     Functional Status Survey: Is the patient deaf or have difficulty hearing?: No Does the patient have difficulty seeing, even when wearing glasses/contacts?: No Does the patient have difficulty concentrating, remembering, or making decisions?: No Does the patient have difficulty walking or climbing stairs?: No Does the patient have difficulty dressing or bathing?: No Does  the patient have difficulty doing errands alone such as visiting a doctor's office or shopping?: No    Assessment & Plan  1. Well woman exam  Discussed importance of 150 minutes of physical activity weekly, eat two servings of fish weekly, eat one serving of tree nuts ( cashews, pistachios, pecans, almonds.Marland Kitchen) every other day, eat 6 servings of fruit/vegetables daily and drink plenty of water and avoid sweet beverages.  - Lipid panel - CBC with Differential/Platelet - COMPLETE METABOLIC PANEL WITH GFR - Hemoglobin A1c - TSH - Vitamin B12 - VITAMIN D 25 Hydroxy (Vit-D Deficiency, Fractures)  2. Cervical cancer screening  Due for next year  3. Vitamin D deficiency  - VITAMIN D 25 Hydroxy (Vit-D Deficiency, Fractures)  4. B12 deficiency  - Vitamin B12  5. Family history of thyroid disease  -  TSH  6. GAD (generalized anxiety disorder)  - DULoxetine (CYMBALTA) 30 MG capsule; Take 1-2 capsules (30-60 mg total) by mouth daily.  Dispense: 60 capsule; Refill: 0  7. Mild recurrent major depression (HCC)  - DULoxetine (CYMBALTA) 30 MG capsule; Take 1-2 capsules (30-60 mg total) by mouth daily.  Dispense: 60 capsule; Refill: 0  8. Lipid screening  - Lipid panel  9. Diabetes mellitus screening  - Hemoglobin A1c

## 2016-09-19 DIAGNOSIS — E559 Vitamin D deficiency, unspecified: Secondary | ICD-10-CM | POA: Diagnosis not present

## 2016-09-19 DIAGNOSIS — Z13220 Encounter for screening for lipoid disorders: Secondary | ICD-10-CM | POA: Diagnosis not present

## 2016-09-19 DIAGNOSIS — Z131 Encounter for screening for diabetes mellitus: Secondary | ICD-10-CM | POA: Diagnosis not present

## 2016-09-19 DIAGNOSIS — E538 Deficiency of other specified B group vitamins: Secondary | ICD-10-CM | POA: Diagnosis not present

## 2016-09-19 DIAGNOSIS — Z01419 Encounter for gynecological examination (general) (routine) without abnormal findings: Secondary | ICD-10-CM | POA: Diagnosis not present

## 2016-09-19 LAB — CBC WITH DIFFERENTIAL/PLATELET
BASOS PCT: 0 %
Basophils Absolute: 0 cells/uL (ref 0–200)
EOS PCT: 2 %
Eosinophils Absolute: 120 cells/uL (ref 15–500)
HEMATOCRIT: 37.2 % (ref 35.0–45.0)
HEMOGLOBIN: 12.1 g/dL (ref 11.7–15.5)
LYMPHS ABS: 2220 {cells}/uL (ref 850–3900)
LYMPHS PCT: 37 %
MCH: 27.5 pg (ref 27.0–33.0)
MCHC: 32.5 g/dL (ref 32.0–36.0)
MCV: 84.5 fL (ref 80.0–100.0)
MONO ABS: 480 {cells}/uL (ref 200–950)
MPV: 9.7 fL (ref 7.5–12.5)
Monocytes Relative: 8 %
NEUTROS PCT: 53 %
Neutro Abs: 3180 cells/uL (ref 1500–7800)
Platelets: 333 10*3/uL (ref 140–400)
RBC: 4.4 MIL/uL (ref 3.80–5.10)
RDW: 13.4 % (ref 11.0–15.0)
WBC: 6 10*3/uL (ref 3.8–10.8)

## 2016-09-20 LAB — COMPLETE METABOLIC PANEL WITH GFR
ALT: 22 U/L (ref 6–29)
AST: 17 U/L (ref 10–30)
Albumin: 3.8 g/dL (ref 3.6–5.1)
Alkaline Phosphatase: 85 U/L (ref 33–115)
BUN: 14 mg/dL (ref 7–25)
CHLORIDE: 108 mmol/L (ref 98–110)
CO2: 25 mmol/L (ref 20–31)
CREATININE: 0.8 mg/dL (ref 0.50–1.10)
Calcium: 9.2 mg/dL (ref 8.6–10.2)
GFR, Est African American: >89 mL/min (ref >=60)
GFR, Est Non African American: >89 mL/min (ref >=60)
GLUCOSE: 90 mg/dL (ref 65–99)
POTASSIUM: 4.8 mmol/L (ref 3.5–5.3)
SODIUM: 140 mmol/L (ref 135–146)
Total Bilirubin: 0.3 mg/dL (ref 0.2–1.2)
Total Protein: 6.7 g/dL (ref 6.1–8.1)

## 2016-09-20 LAB — TSH: TSH: 3.24 mIU/L

## 2016-09-20 LAB — LIPID PANEL
Cholesterol: 158 mg/dL (ref 125–200)
HDL: 54 mg/dL (ref >=46)
LDL CALC: 95 mg/dL (ref <130)
Total CHOL/HDL Ratio: 2.9 Ratio (ref <=5.0)
Triglycerides: 46 mg/dL (ref <150)
VLDL: 9 mg/dL (ref <30)

## 2016-09-20 LAB — VITAMIN B12: VITAMIN B 12: 226 pg/mL (ref 200–1100)

## 2016-09-20 LAB — VITAMIN D 25 HYDROXY (VIT D DEFICIENCY, FRACTURES): VIT D 25 HYDROXY: 36 ng/mL (ref 30–100)

## 2016-09-20 LAB — HEMOGLOBIN A1C
Hgb A1c MFr Bld: 5 % (ref <5.7)
Mean Plasma Glucose: 97 mg/dL

## 2016-11-28 ENCOUNTER — Telehealth: Payer: Self-pay | Admitting: Family Medicine

## 2016-11-28 NOTE — Telephone Encounter (Signed)
Pt states she needs a refill on Cymbalta. Pt was having heart palpitations and dizziness. Pt advised to go to UC or ER just to double check with EKG.

## 2016-11-29 ENCOUNTER — Encounter: Payer: Self-pay | Admitting: Physician Assistant

## 2016-11-29 ENCOUNTER — Ambulatory Visit: Payer: Self-pay | Admitting: Physician Assistant

## 2016-11-29 VITALS — BP 120/80 | HR 112 | Temp 98.7°F

## 2016-11-29 DIAGNOSIS — J020 Streptococcal pharyngitis: Secondary | ICD-10-CM

## 2016-11-29 LAB — POCT RAPID STREP A (OFFICE): RAPID STREP A SCREEN: POSITIVE — AB

## 2016-11-29 MED ORDER — FLUCONAZOLE 150 MG PO TABS: ORAL_TABLET | ORAL | 0 refills | Status: DC

## 2016-11-29 MED ORDER — AMOXICILLIN 875 MG PO TABS: 875.0000 mg | ORAL_TABLET | Freq: Two times a day (BID) | ORAL | 0 refills | Status: DC

## 2016-11-29 MED ORDER — METOPROLOL SUCCINATE ER 25 MG PO TB24: 25.0000 mg | ORAL_TABLET | Freq: Every day | ORAL | 3 refills | Status: DC

## 2016-11-29 NOTE — Progress Notes (Signed)
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

## 2016-12-01 ENCOUNTER — Other Ambulatory Visit: Payer: Self-pay | Admitting: Family Medicine

## 2016-12-01 DIAGNOSIS — F330 Major depressive disorder, recurrent, mild: Secondary | ICD-10-CM

## 2016-12-01 DIAGNOSIS — F411 Generalized anxiety disorder: Secondary | ICD-10-CM

## 2016-12-01 MED ORDER — DULOXETINE HCL 30 MG PO CPEP: 30.0000 mg | ORAL_CAPSULE | Freq: Every day | ORAL | 0 refills | Status: DC

## 2016-12-11 ENCOUNTER — Ambulatory Visit (INDEPENDENT_AMBULATORY_CARE_PROVIDER_SITE_OTHER): Payer: 59 | Admitting: Family Medicine

## 2016-12-11 ENCOUNTER — Encounter: Payer: Self-pay | Admitting: Family Medicine

## 2016-12-11 VITALS — BP 116/70 | HR 98 | Temp 98.3°F | Resp 18 | Wt 181.4 lb

## 2016-12-11 DIAGNOSIS — F411 Generalized anxiety disorder: Secondary | ICD-10-CM

## 2016-12-11 DIAGNOSIS — R002 Palpitations: Secondary | ICD-10-CM

## 2016-12-11 DIAGNOSIS — J4520 Mild intermittent asthma, uncomplicated: Secondary | ICD-10-CM

## 2016-12-11 DIAGNOSIS — F330 Major depressive disorder, recurrent, mild: Secondary | ICD-10-CM

## 2016-12-11 MED ORDER — DULOXETINE HCL 60 MG PO CPEP: 60.0000 mg | ORAL_CAPSULE | Freq: Every day | ORAL | 1 refills | Status: DC

## 2016-12-11 NOTE — Progress Notes (Signed)
Name: Ashley Patel   MRN: [id number]    DOB: 23-May-1981   Date:12/11/2016       Progress Note  Subjective  Chief Complaint  Chief Complaint  Patient presents with  . Follow-up    patient is here for her 6 month f/u  . Depression  . Anxiety    HPI  Palpitation: she states [facility] because she was having forceful heart beat multiple times a day, she was not sure if it was related to anxiety.  She was given Metoprolol but never filled. BP is towards low end of normal and we will hold off on starting that until she sees cardiologist  Major Depression Mild: she has a long history of depression, she was started on medication as a teenager and has seen a therapist. She states mother and grandmother also have similar symptoms. She resumed Cymbalta October 2017 and she was doing well, but ran out of medication, she would like to resume it. She is having anhedonia, and is feeling a little more sad, but she states if she does not resume medication it will get worse. She denies suicidal thoughts or ideation.   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 She felt more anxious around Christmas time, she is not sure that palpitation was related to stress, but worrying about being a cardiac cause it is making it worse. She ran out of Cymbalta, but it was not around the time that symptoms of palpitation happened. She states last episode of palpitation was over one week. She is no longer waking up at night, not worrying as much now. She wants to resume Cymbalta  Asthma: she states no symptoms at this time, denies wheezing, cough or chest tightness.    Patient Active Problem List   Diagnosis Date Noted  . Panic attack 08/11/2015  . B12 deficiency 08/05/2015  . Acne 08/05/2015  . Blood glucose elevated 08/05/2015  . IBS (irritable bowel syndrome) 08/05/2015  . Numerous moles 08/05/2015  . Vitamin D  deficiency 08/05/2015  . Family history of thyroid disease 08/05/2015  . Asthma without status asthmaticus 06/16/2010  . Anxiety and depression 06/16/2010    Past Surgical History:  Procedure Laterality Date  . CHOLECYSTECTOMY    . TONSILLECTOMY AND ADENOIDECTOMY      Family History  Problem Relation Age of Onset  . Cardiomyopathy Mother   . Macrocephaly Son     Social History   Social History  . Marital status: Married    Spouse name: N/A  . Number of children: N/A  . Years of education: N/A   Occupational History  . Not on file.   Social History Main Topics  . Smoking status: Former Smoker    Packs/day: 0.50    Years: 5.00    Types: Cigarettes    Start date: 12/04/2003    Quit date: 11/04/2009  . Smokeless tobacco: Never Used  . Alcohol use 0.0 oz/week     Comment: occasionally  . Drug use: No  . Sexual activity: Yes    Partners: Male    Birth control/ protection: Implant   Other Topics Concern  . Not on file   Social History Narrative  . No narrative on file     Current Outpatient Prescriptions:  .  cyanocobalamin 100 MCG tablet, Place under the tongue., Disp: , Rfl:  .  DULoxetine (CYMBALTA) 60 MG capsule,  Take 1 capsule (60 mg total) by mouth daily., Disp: 90 capsule, Rfl: 1 .  Levonorgestrel-Ethinyl Estradiol (SEASONIQUE) 0.15-0.03 &0.01 MG tablet, Take by mouth., Disp: , Rfl:  .  metoprolol succinate (TOPROL-XL) 25 MG 24 hr tablet, Take 1 tablet (25 mg total) by mouth daily., Disp: 90 tablet, Rfl: 3  Allergies  Allergen Reactions  . Cymbalta [Duloxetine Hcl]     Abdominal Pain     ROS  Constitutional: Negative for fever, positive for weight change.  Respiratory: Negative for cough and shortness of breath.   Cardiovascular: Negative for chest pain , but she had some palpitations.  Gastrointestinal: Negative for abdominal pain, no bowel changes.  Musculoskeletal: Negative for gait problem or joint swelling.  Skin: Negative for rash.   Neurological: Negative for dizziness or headache.  No other specific complaints in a complete review of systems (except as listed in HPI above).  Objective  Vitals:   12/11/16 0753  BP: 116/70  Pulse: 98  Resp: 18  Temp: 98.3 F (36.8 C)  TempSrc: Oral  SpO2: 98%  Weight: 181 lb 6.4 oz (82.3 kg)    Body mass index is 26.79 kg/m.  Physical Exam  Constitutional: Patient appears well-developed and well-nourished. Overweight No distress.  HEENT: head atraumatic, normocephalic, pupils equal and reactive to light,  neck supple, throat within normal limits Cardiovascular: Normal rate, regular rhythm and normal heart sounds.  No murmur heard. No BLE edema. Pulmonary/Chest: Effort normal and breath sounds normal. No respiratory distress. Abdominal: Soft.  There is no tenderness. Psychiatric: Patient has a normal mood and affect. behavior is normal. Judgment and thought content normal.   Recent Results (from the past 2160 hour(s))  Lipid panel     Status: None   Collection Time: 09/19/16  8:23 AM  Result Value Ref Range   Cholesterol 158 125 - 200 mg/dL   Triglycerides 46 <150 mg/dL   HDL 54 >=46 mg/dL   Total CHOL/HDL Ratio 2.9 <=5.0 Ratio   VLDL 9 <30 mg/dL   LDL Cholesterol 95 <130 mg/dL    Comment:   Total Cholesterol/HDL Ratio:CHD Risk                        Coronary Heart Disease Risk Table                                        Men       Women          1/2 Average Risk              3.4        3.3              Average Risk              5.0        4.4           2X Average Risk              9.6        7.1           3X Average Risk             23.4       11.0 Use the calculated Patient Ratio above and the CHD Risk table  to determine the patient's CHD Risk.   CBC with Differential/Platelet  Status: None   Collection Time: 09/19/16  8:23 AM  Result Value Ref Range   WBC 6.0 3.8 - 10.8 K/uL   RBC 4.40 3.80 - 5.10 MIL/uL   Hemoglobin 12.1 11.7 - 15.5 g/dL   HCT 37.2  35.0 - 45.0 %   MCV 84.5 80.0 - 100.0 fL   MCH 27.5 27.0 - 33.0 pg   MCHC 32.5 32.0 - 36.0 g/dL   RDW 13.4 11.0 - 15.0 %   Platelets 333 140 - 400 K/uL   MPV 9.7 7.5 - 12.5 fL   Neutro Abs 3,180 1,500 - 7,800 cells/uL   Lymphs Abs 2,220 850 - 3,900 cells/uL   Monocytes Absolute 480 200 - 950 cells/uL   Eosinophils Absolute 120 15 - 500 cells/uL   Basophils Absolute 0 0 - 200 cells/uL   Neutrophils Relative % 53 %   Lymphocytes Relative 37 %   Monocytes Relative 8 %   Eosinophils Relative 2 %   Basophils Relative 0 %   Smear Review Criteria for review not met   COMPLETE METABOLIC PANEL WITH GFR     Status: None   Collection Time: 09/19/16  8:23 AM  Result Value Ref Range   Sodium 140 135 - 146 mmol/L   Potassium 4.8 3.5 - 5.3 mmol/L   Chloride 108 98 - 110 mmol/L   CO2 25 20 - 31 mmol/L   Glucose, Bld 90 65 - 99 mg/dL   BUN 14 7 - 25 mg/dL   Creat 0.80 0.50 - 1.10 mg/dL   Total Bilirubin 0.3 0.2 - 1.2 mg/dL   Alkaline Phosphatase 85 33 - 115 U/L   AST 17 10 - 30 U/L   ALT 22 6 - 29 U/L   Total Protein 6.7 6.1 - 8.1 g/dL   Albumin 3.8 3.6 - 5.1 g/dL   Calcium 9.2 8.6 - 10.2 mg/dL   GFR, Est African American >89 >=60 mL/min   GFR, Est Non African American >89 >=60 mL/min  Hemoglobin A1c     Status: None   Collection Time: 09/19/16  8:23 AM  Result Value Ref Range   Hgb A1c MFr Bld 5.0 <5.7 %    Comment:   For the purpose of screening for the presence of diabetes:   <5.7%       Consistent with the absence of diabetes 5.7-6.4 %   Consistent with increased risk for diabetes (prediabetes) >=6.5 %     Consistent with diabetes   This assay result is consistent with a decreased risk of diabetes.   Currently, no consensus exists regarding use of hemoglobin A1c for diagnosis of diabetes in children.   According to American Diabetes Association (ADA) guidelines, hemoglobin A1c <7.0% represents optimal control in non-pregnant diabetic patients. Different metrics may apply to  specific patient populations. Standards of Medical Care in Diabetes (ADA).      Mean Plasma Glucose 97 mg/dL  TSH     Status: None   Collection Time: 09/19/16  8:23 AM  Result Value Ref Range   TSH 3.24 mIU/L    Comment:   Reference Range   > or = 20 Years  0.40-4.50   Pregnancy Range First trimester  0.26-2.66 Second trimester 0.55-2.73 Third trimester  0.43-2.91     Vitamin B12     Status: None   Collection Time: 09/19/16  8:23 AM  Result Value Ref Range   Vitamin B-12 226 200 - 1,100 pg/mL  VITAMIN D 25 Hydroxy (Vit-D Deficiency, Fractures)  Status: None   Collection Time: 09/19/16  8:23 AM  Result Value Ref Range   Vit D, 25-Hydroxy 36 30 - 100 ng/mL    Comment: Vitamin D Status           25-OH Vitamin D        Deficiency                <20 ng/mL        Insufficiency         20 - 29 ng/mL        Optimal             > or = 30 ng/mL   For 25-OH Vitamin D testing on patients on D2-supplementation and patients for whom quantitation of D2 and D3 fractions is required, the QuestAssureD 25-OH VIT D, (D2,D3), LC/MS/MS is recommended: order code [id number] (patients > 2 yrs).   POCT Rapid Strep A-Office (CPT [id number])     Status: Abnormal   Collection Time: 11/29/16 11:08 AM  Result Value Ref Range   Rapid Strep A Screen Positive (A) Negative     PHQ2/9: Depression screen [facility] 2/9 09/12/2016 06/11/2016 04/26/2016 08/11/2015  Decreased Interest 2 0 0 0  Down, Depressed, Hopeless 2 0 0 0  PHQ - 2 Score 4 0 0 0  Altered sleeping 2 - - -  Tired, decreased energy 1 - - -  Change in appetite 1 - - -  Feeling bad or failure about yourself  0 - - -  Trouble concentrating 0 - - -  Moving slowly or fidgety/restless 0 - - -  Suicidal thoughts 0 - - -  PHQ-9 Score 8 - - -  Difficult doing work/chores Somewhat difficult - - -     Fall Risk: Fall Risk  09/12/2016 06/11/2016 04/26/2016 08/11/2015  Falls in the past year? No No No No      Assessment & Plan  1. GAD (generalized  anxiety disorder)  - DULoxetine (CYMBALTA) 60 MG capsule; Take 1 capsule (60 mg total) by mouth daily.  Dispense: 90 capsule; Refill: 1  2. Mild recurrent major depression (HCC)  - DULoxetine (CYMBALTA) 60 MG capsule; Take 1 capsule (60 mg total) by mouth daily.  Dispense: 90 capsule; Refill: 1  3. Palpitation  - Ambulatory referral to Cardiology  4. Asthma without status asthmaticus, mild intermittent, uncomplicated  Doing well at this time

## 2016-12-14 ENCOUNTER — Encounter: Payer: Self-pay | Admitting: *Deleted

## 2016-12-18 ENCOUNTER — Ambulatory Visit: Payer: Self-pay | Admitting: Physician Assistant

## 2016-12-18 ENCOUNTER — Encounter: Payer: Self-pay | Admitting: Physician Assistant

## 2016-12-18 VITALS — BP 120/70 | HR 84 | Temp 98.3°F

## 2016-12-18 DIAGNOSIS — J029 Acute pharyngitis, unspecified: Secondary | ICD-10-CM

## 2016-12-18 LAB — POCT RAPID STREP A (OFFICE): Rapid Strep A Screen: NEGATIVE

## 2016-12-18 NOTE — Progress Notes (Signed)
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

## 2016-12-27 ENCOUNTER — Ambulatory Visit: Payer: 59 | Admitting: Family Medicine

## 2017-01-17 ENCOUNTER — Encounter: Payer: Self-pay | Admitting: Physician Assistant

## 2017-01-17 ENCOUNTER — Ambulatory Visit: Payer: Self-pay | Admitting: Physician Assistant

## 2017-01-17 VITALS — BP 120/84 | HR 102 | Temp 98.5°F

## 2017-01-17 DIAGNOSIS — R300 Dysuria: Secondary | ICD-10-CM

## 2017-01-17 DIAGNOSIS — J020 Streptococcal pharyngitis: Secondary | ICD-10-CM

## 2017-01-17 LAB — POCT URINALYSIS DIPSTICK
Glucose, UA: NEGATIVE
NITRITE UA: NEGATIVE
Spec Grav, UA: >=1.03
Urobilinogen, UA: 0.2
pH, UA: 5.5

## 2017-01-17 LAB — POCT RAPID STREP A (OFFICE): RAPID STREP A SCREEN: POSITIVE — AB

## 2017-01-17 MED ORDER — FLUCONAZOLE 150 MG PO TABS: ORAL_TABLET | ORAL | 0 refills | Status: DC

## 2017-01-17 MED ORDER — AMOXICILLIN 875 MG PO TABS: 875.0000 mg | ORAL_TABLET | Freq: Two times a day (BID) | ORAL | 0 refills | Status: DC

## 2017-01-17 NOTE — Progress Notes (Signed)
S: c/o sore throat x 2 days, nausea, fever, no cough or congestion, states she is also having clear vaginal discharge and some dysuria, thought it was yeast so took a diflucan without much relief, no abd pain  O: vitals wnl, nad, tms clear, throat bright red, neck supple no lymph, lungs c t a, cv rrr, q strep +, ua 1+ leuks  A: strep throat, vaginal discharge  P: amoxil 875 bid x 10d, diflucan  F/u with gyn for eval of vaginal discharge

## 2017-01-31 ENCOUNTER — Ambulatory Visit: Payer: Self-pay | Admitting: Physician Assistant

## 2017-02-18 ENCOUNTER — Other Ambulatory Visit: Payer: Self-pay | Admitting: Family Medicine

## 2017-02-18 ENCOUNTER — Encounter: Payer: Self-pay | Admitting: Physician Assistant

## 2017-02-18 ENCOUNTER — Ambulatory Visit: Payer: Self-pay | Admitting: Physician Assistant

## 2017-02-18 VITALS — BP 120/80 | HR 98 | Temp 98.6°F

## 2017-02-18 DIAGNOSIS — F411 Generalized anxiety disorder: Secondary | ICD-10-CM

## 2017-02-18 DIAGNOSIS — F330 Major depressive disorder, recurrent, mild: Secondary | ICD-10-CM

## 2017-02-18 DIAGNOSIS — J0100 Acute maxillary sinusitis, unspecified: Secondary | ICD-10-CM

## 2017-02-18 MED ORDER — PREDNISONE 10 MG PO TABS: 30.0000 mg | ORAL_TABLET | Freq: Every day | ORAL | 0 refills | Status: DC

## 2017-02-18 NOTE — Telephone Encounter (Signed)
Patient requesting refill of Duloxetine to [facility].

## 2017-02-18 NOTE — Progress Notes (Signed)
S: C/o runny nose and congestion for 5 days, no fever, chills, cp/sob, v/d; mucus is green and thick in the mornings and late at night, pt had strep throat in Feb, had amoxil and got a really bad yeast infection, c/o of facial and dental pain.   Using otc meds:   O: PE: vitals wnl nad, perrl eomi, normocephalic, tms dull, nasal mucosa red and swollen, throat injected, neck supple no lymph, lungs c t a, cv rrr, neuro intact  A:  Acute sinusitis   P: drink fluids, continue regular meds , use otc meds of choice, return if not improving in 5 days, return earlier if worsening, pred 30mg  qd x3d, saline nasal rinse, if worsening will consider an antibiotic, have concerns that the patient has been on too many recently

## 2017-02-19 ENCOUNTER — Other Ambulatory Visit: Payer: Self-pay | Admitting: Family Medicine

## 2017-02-19 DIAGNOSIS — B373 Candidiasis of vulva and vagina: Secondary | ICD-10-CM | POA: Diagnosis not present

## 2017-02-19 DIAGNOSIS — N898 Other specified noninflammatory disorders of vagina: Secondary | ICD-10-CM | POA: Diagnosis not present

## 2017-02-19 MED ORDER — DULOXETINE HCL 60 MG PO CPEP: 60.0000 mg | ORAL_CAPSULE | Freq: Every day | ORAL | 0 refills | Status: DC

## 2017-02-19 NOTE — Telephone Encounter (Signed)
Per the request of Dr. Ancil Boozer, I contacted this patient to see if she was taking 1 or 2 pills. Patient stated that she was taking 2 pills/ day.

## 2017-03-05 ENCOUNTER — Ambulatory Visit: Payer: Self-pay | Admitting: Internal Medicine

## 2017-03-06 DIAGNOSIS — N898 Other specified noninflammatory disorders of vagina: Secondary | ICD-10-CM | POA: Diagnosis not present

## 2017-03-06 DIAGNOSIS — B373 Candidiasis of vulva and vagina: Secondary | ICD-10-CM | POA: Diagnosis not present

## 2017-03-19 ENCOUNTER — Other Ambulatory Visit: Payer: Self-pay | Admitting: Family Medicine

## 2017-03-19 ENCOUNTER — Ambulatory Visit: Payer: Self-pay | Admitting: Physician Assistant

## 2017-03-19 ENCOUNTER — Ambulatory Visit (INDEPENDENT_AMBULATORY_CARE_PROVIDER_SITE_OTHER): Payer: 59 | Admitting: Family Medicine

## 2017-03-19 ENCOUNTER — Encounter: Payer: Self-pay | Admitting: Family Medicine

## 2017-03-19 VITALS — BP 110/72 | HR 101 | Temp 98.2°F | Resp 18 | Ht 69.0 in | Wt 182.0 lb

## 2017-03-19 DIAGNOSIS — J4520 Mild intermittent asthma, uncomplicated: Secondary | ICD-10-CM

## 2017-03-19 DIAGNOSIS — F411 Generalized anxiety disorder: Secondary | ICD-10-CM

## 2017-03-19 DIAGNOSIS — N760 Acute vaginitis: Secondary | ICD-10-CM | POA: Diagnosis not present

## 2017-03-19 DIAGNOSIS — J029 Acute pharyngitis, unspecified: Secondary | ICD-10-CM | POA: Diagnosis not present

## 2017-03-19 DIAGNOSIS — R5383 Other fatigue: Secondary | ICD-10-CM | POA: Diagnosis not present

## 2017-03-19 DIAGNOSIS — E538 Deficiency of other specified B group vitamins: Secondary | ICD-10-CM | POA: Diagnosis not present

## 2017-03-19 DIAGNOSIS — E559 Vitamin D deficiency, unspecified: Secondary | ICD-10-CM | POA: Diagnosis not present

## 2017-03-19 DIAGNOSIS — F330 Major depressive disorder, recurrent, mild: Secondary | ICD-10-CM | POA: Diagnosis not present

## 2017-03-19 DIAGNOSIS — R739 Hyperglycemia, unspecified: Secondary | ICD-10-CM | POA: Diagnosis not present

## 2017-03-19 LAB — CBC WITH DIFFERENTIAL/PLATELET
Basophils Absolute: 0 cells/uL (ref 0–200)
Basophils Relative: 0 %
Eosinophils Absolute: 249 cells/uL (ref 15–500)
Eosinophils Relative: 3 %
HCT: 38.8 % (ref 35.0–45.0)
Hemoglobin: 12.8 g/dL (ref 11.7–15.5)
LYMPHS PCT: 25 %
Lymphs Abs: 2075 cells/uL (ref 850–3900)
MCH: 27.8 pg (ref 27.0–33.0)
MCHC: 33 g/dL (ref 32.0–36.0)
MCV: 84.3 fL (ref 80.0–100.0)
MONOS PCT: 8 %
MPV: 9 fL (ref 7.5–12.5)
Monocytes Absolute: 664 cells/uL (ref 200–950)
NEUTROS PCT: 64 %
Neutro Abs: 5312 cells/uL (ref 1500–7800)
Platelets: 396 10*3/uL (ref 140–400)
RBC: 4.6 MIL/uL (ref 3.80–5.10)
RDW: 13.7 % (ref 11.0–15.0)
WBC: 8.3 10*3/uL (ref 3.8–10.8)

## 2017-03-19 MED ORDER — RANITIDINE HCL 150 MG PO CAPS: 150.0000 mg | ORAL_CAPSULE | Freq: Two times a day (BID) | ORAL | 0 refills | Status: DC

## 2017-03-19 MED ORDER — DULOXETINE HCL 60 MG PO CPEP: 60.0000 mg | ORAL_CAPSULE | Freq: Every day | ORAL | 1 refills | Status: DC

## 2017-03-19 NOTE — Progress Notes (Signed)
Name: Ashley Patel   MRN: [id number]    DOB: May 30, 1981   Date:03/19/2017       Progress Note  Subjective  Chief Complaint  Chief Complaint  Patient presents with  . Follow-up    patient is here for a 3 month f/u  . Asthma    well controlled  . Palpitations    f/u with cardiologist tomorrow  . Depression    patient stated she feels better  . Anxiety  . Vaginitis    discharge (clear and white), itching & burning, started about 3 weeks ago. seen [facility] but has not resolved    HPI  Palpitation: she states she went to [employer] around Christmas because she was having forceful heart beat multiple times a day, she was not sure if it was related to anxiety.  She was given Metoprolol but never filled. BP is towards low end of normal and she has an appointment with cardiologist tomorrow.   Major Depression Mild: she has a long history of depression, she was started on medication as a teenager and has seen a therapist. She states mother and grandmother also have similar symptoms. She resumed Cymbalta October 2017 and she was doing well, but ran out of medication and symptoms recurred. She states she is doing a lot better now. She states she no longer feels sad and hopeless, but still feels tired all time. She denies suicidal thoughts and ideation. Sleeping 11 hours per night.   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 She felt more anxious around Christmas time, she is not sure that palpitation was related to stress, but worrying about being a cardiac cause it is making it worse. She ran out of Cymbalta, but it was not around the time that symptoms of palpitation happened. She states last episode of palpitation was over one week. She is no longer waking up at night, not worrying as much now. She feels like Cymbalta is helping  Sore throat: symptoms in 12/2016 [facility] and given reassurance, went  back Feb and given Amoxil for strep throat and again one month ago, and not given any medication. She continue to have sore throat, when she swallows. Denies allergy symptoms or heartburn, no nocturnal cough. Symptoms only present when she swallow. Advised to try GERD medication and may need to see ENT/GI if no resolution  Asthma: she states no symptoms at this time, denies wheezing, cough or chest tightness.   Recurrent Vaginitis: she states that over the past 4 weeks she developed severe vulvar rash with satellite lesions, when to Gyn and was given Diflucan however continues to have symptoms. Rash resolved, but still has vaginal discharge, itching and burning. Looking through Dr. Chase Picket notes he would like to get a candida culture but we don't have the swab here, advised to follow up with him.   Fatigue: feeling tired, worried about not feeling well, and the recurrent vaginitis and sore throat. We will check labs.   Patient Active Problem List   Diagnosis Date Noted  . Panic attack 08/11/2015  . B12 deficiency 08/05/2015  . Acne 08/05/2015  . Blood glucose elevated 08/05/2015  . IBS (irritable bowel syndrome) 08/05/2015  . Numerous moles 08/05/2015  . Vitamin D deficiency 08/05/2015  . Family history of thyroid disease 08/05/2015  . Asthma without status asthmaticus 06/16/2010  . Anxiety and depression 06/16/2010    Past Surgical History:  Procedure Laterality Date  . CHOLECYSTECTOMY    . TONSILLECTOMY AND ADENOIDECTOMY      Family History  Problem Relation Age of Onset  . Cardiomyopathy Mother   . Macrocephaly Son     Social History   Social History  . Marital status: Married    Spouse name: N/A  . Number of children: N/A  . Years of education: N/A   Occupational History  . Not on file.   Social History Main Topics  . Smoking status: Former Smoker    Packs/day: 0.50    Years: 5.00    Types: Cigarettes    Start date: 12/04/2003    Quit date: 11/04/2009  .  Smokeless tobacco: Never Used  . Alcohol use 0.0 oz/week     Comment: occasionally  . Drug use: No  . Sexual activity: Yes    Partners: Male    Birth control/ protection: Implant   Other Topics Concern  . Not on file   Social History Narrative  . No narrative on file     Current Outpatient Prescriptions:  .  Cyanocobalamin (VITAMIN B-12) 1000 MCG SUBL, Place 1 tablet under the tongue daily., Disp: , Rfl:  .  DULoxetine (CYMBALTA) 60 MG capsule, Take 1 capsule (60 mg total) by mouth daily., Disp: 30 capsule, Rfl: 0 .  Levonorgestrel-Ethinyl Estradiol (AMETHIA,CAMRESE) 0.15-0.03 &0.01 MG tablet, Take 1 tablet by mouth daily., Disp: , Rfl:   Allergies  Allergen Reactions  . Amoxil [Amoxicillin]     Severe yeast   . Cymbalta [Duloxetine Hcl]     Abdominal Pain     ROS  Constitutional: Negative for fever or weight change.  Respiratory: Negative for cough and shortness of breath.  Sore throat Cardiovascular: Negative for chest pain or palpitations.  Gastrointestinal: Negative for abdominal pain, no bowel changes.  Musculoskeletal: Negative for gait problem or joint swelling.  Skin: Negative for rash.  Neurological: Negative for dizziness or headache.  No other specific complaints in a complete review of systems (except as listed in HPI above).  Objective  Vitals:   03/19/17 0754  BP: 110/72  Pulse: (!) 101  Resp: 18  Temp: 98.2 F (36.8 C)  TempSrc: Oral  SpO2: 98%  Weight: 182 lb (82.6 kg)  Height: 5\' 9"  (1.753 m)    Body mass index is 26.88 kg/m.  Physical Exam  Constitutional: Patient appears well-developed and well-nourished. Obese No distress.  HEENT: head atraumatic, normocephalic, pupils equal and reactive to light, ears normal TM bilaterally, neck supple, throat mild erythema, and tonsils absent Cardiovascular: Normal rate, regular rhythm and normal heart sounds.  No murmur heard. No BLE edema. Pulmonary/Chest: Effort normal and breath sounds normal.  No respiratory distress. Abdominal: Soft.  There is no tenderness. Psychiatric: Patient has a normal mood and affect. behavior is normal. Judgment and thought content normal.  Recent Results (from the past 2160 hour(s))  POCT Urinalysis Dipstick (CPT 81002)     Status: Abnormal   Collection Time: 01/17/17  8:36 AM  Result Value Ref Range   Color, UA yellow    Clarity, UA cloudy    Glucose, UA neg    Bilirubin, UA 1+    Ketones, UA 1+    Spec Grav, UA >=1.030    Blood, UA 1+    pH, UA 5.5    Protein, UA 2+    Urobilinogen, UA 0.2    Nitrite, UA neg    Leukocytes, UA small (1+) (A) Negative  POCT Rapid Strep  A-Office (CPT [id number])     Status: Abnormal   Collection Time: 01/17/17  8:58 AM  Result Value Ref Range   Rapid Strep A Screen Positive (A) Negative    GAD 7 : Generalized Anxiety Score 03/19/2017  Nervous, Anxious, on Edge 2  Control/stop worrying 1  Worry too much - different things 2  Trouble relaxing 3  Restless 0  Easily annoyed or irritable 3  Afraid - awful might happen 1  Total GAD 7 Score 12  Anxiety Difficulty Somewhat difficult     PHQ2/9: Depression screen [facility] 2/9 03/19/2017 09/12/2016 06/11/2016 04/26/2016 08/11/2015  Decreased Interest 0 2 0 0 0  Down, Depressed, Hopeless 0 2 0 0 0  PHQ - 2 Score 0 4 0 0 0  Altered sleeping - 2 - - -  Tired, decreased energy - 1 - - -  Change in appetite - 1 - - -  Feeling bad or failure about yourself  - 0 - - -  Trouble concentrating - 0 - - -  Moving slowly or fidgety/restless - 0 - - -  Suicidal thoughts - 0 - - -  PHQ-9 Score - 8 - - -  Difficult doing work/chores - Somewhat difficult - - -     Fall Risk: Fall Risk  03/19/2017 09/12/2016 06/11/2016 04/26/2016 08/11/2015  Falls in the past year? No No No No No      Functional Status Survey: Is the patient deaf or have difficulty hearing?: No Does the patient have difficulty seeing, even when wearing glasses/contacts?: No Does the patient have difficulty  concentrating, remembering, or making decisions?: No Does the patient have difficulty walking or climbing stairs?: No Does the patient have difficulty dressing or bathing?: No Does the patient have difficulty doing errands alone such as visiting a doctor's office or shopping?: No    Assessment & Plan  1. GAD (generalized anxiety disorder)  - DULoxetine (CYMBALTA) 60 MG capsule; Take 1 capsule (60 mg total) by mouth daily.  Dispense: 90 capsule; Refill: 1  2. Mild recurrent major depression (HCC)  - DULoxetine (CYMBALTA) 60 MG capsule; Take 1 capsule (60 mg total) by mouth daily.  Dispense: 90 capsule; Refill: 1  3. Asthma without status asthmaticus, mild intermittent, uncomplicated  Doing well   4. B12 deficiency  - Vitamin B12  5. Vitamin D deficiency  - VITAMIN D 25 Hydroxy (Vit-D Deficiency, Fractures)  6. Recurrent vaginitis  Go back to Gyn   7. Sore throat  - Epstein-Barr virus VCA antibody panel - Culture, Group A Strep We will start her on Ranitidine, and refer to ENT if no improvement and culture negative  8. Other fatigue  - CBC with Differential/Platelet - COMPLETE METABOLIC PANEL WITH GFR - TSH  9. Hyperglycemia  - Hemoglobin A1c - Insulin, fasting

## 2017-03-20 ENCOUNTER — Ambulatory Visit (INDEPENDENT_AMBULATORY_CARE_PROVIDER_SITE_OTHER): Payer: 59 | Admitting: Internal Medicine

## 2017-03-20 ENCOUNTER — Ambulatory Visit (INDEPENDENT_AMBULATORY_CARE_PROVIDER_SITE_OTHER): Payer: 59

## 2017-03-20 ENCOUNTER — Encounter: Payer: Self-pay | Admitting: Internal Medicine

## 2017-03-20 VITALS — BP 122/68 | HR 77 | Ht 69.0 in | Wt 180.8 lb

## 2017-03-20 DIAGNOSIS — R42 Dizziness and giddiness: Secondary | ICD-10-CM

## 2017-03-20 DIAGNOSIS — R002 Palpitations: Secondary | ICD-10-CM

## 2017-03-20 LAB — COMPLETE METABOLIC PANEL WITH GFR
ALT: 14 U/L (ref 6–29)
AST: 14 U/L (ref 10–30)
Albumin: 4 g/dL (ref 3.6–5.1)
Alkaline Phosphatase: 94 U/L (ref 33–115)
BUN: 15 mg/dL (ref 7–25)
CALCIUM: 9.4 mg/dL (ref 8.6–10.2)
CHLORIDE: 105 mmol/L (ref 98–110)
CO2: 26 mmol/L (ref 20–31)
Creat: 0.8 mg/dL (ref 0.50–1.10)
GFR, Est Non African American: >89 mL/min (ref >=60)
Glucose, Bld: 82 mg/dL (ref 65–99)
POTASSIUM: 4.7 mmol/L (ref 3.5–5.3)
Sodium: 140 mmol/L (ref 135–146)
Total Bilirubin: 0.3 mg/dL (ref 0.2–1.2)
Total Protein: 7.1 g/dL (ref 6.1–8.1)

## 2017-03-20 LAB — HEMOGLOBIN A1C
HEMOGLOBIN A1C: 5 % (ref <5.7)
Mean Plasma Glucose: 97 mg/dL

## 2017-03-20 LAB — EPSTEIN-BARR VIRUS VCA ANTIBODY PANEL: EBV NA IgG: >600 U/mL — ABNORMAL HIGH

## 2017-03-20 LAB — VITAMIN B12: Vitamin B-12: 331 pg/mL (ref 200–1100)

## 2017-03-20 LAB — VITAMIN D 25 HYDROXY (VIT D DEFICIENCY, FRACTURES): VIT D 25 HYDROXY: 19 ng/mL — AB (ref 30–100)

## 2017-03-20 LAB — INSULIN, FASTING: Insulin fasting, serum: 9.2 u[IU]/mL (ref 2.0–19.6)

## 2017-03-20 LAB — TSH: TSH: 2.86 mIU/L

## 2017-03-20 NOTE — Patient Instructions (Signed)
Medication Instructions:  Your physician recommends that you continue on your current medications as directed. Please refer to the Current Medication list given to you today.   Labwork: NONE  Testing/Procedures: Your physician has requested that you have an echocardiogram. Echocardiography is a painless test that uses sound waves to create images of your heart. It provides your doctor with information about the size and shape of your heart and how well your heart's chambers and valves are working. This procedure takes approximately one hour. There are no restrictions for this procedure.  Your physician has recommended that you wear a 14 DAY ZIO PATCH event monitor. Event monitors are medical devices that record the heart's electrical activity. Doctors most often [place] these monitors to diagnose arrhythmias. Arrhythmias are problems with the speed or rhythm of the heartbeat. The monitor is a small, portable device. You can wear one while you do your normal daily activities. This is usually used to diagnose what is causing palpitations/syncope (passing out). - YOU WILL WEAR IT FOR 14 DAYS. - REMOVE ON 04/03/17.  Follow-Up: Your physician recommends that you schedule a follow-up appointment in: [place].   If you need a refill on your cardiac medications before your next appointment, please call your pharmacy.

## 2017-03-20 NOTE — Progress Notes (Signed)
New Outpatient Visit Date: 03/20/2017  Referring Provider: Steele Sizer, MD [address] [place], [place] [address]  Chief Complaint: Palpitations  HPI:  Ashley Patel is a 36 y.o. female who is being seen today for the evaluation of palpitations and elevated heart rate at the request of Dr. Ancil Boozer. She has a history of syncope at age 37 with diagnosis of prolonged QT (details not available), asthma, urine bowel syndrome, depression, and anxiety. Ashley Patel reports that for the last 6 months, she has intermittently found her heart rate to be somewhat elevated near 100 bpm. She does not have any accompanying symptoms including palpitations, lightheadedness, chest pain, and shortness of breath. For 2 weeks around Christmas, she became more aware of her heart beating forcefully and somewhat rapidly. She experienced accompanying lightheadedness and presyncope, though she never passed out. Episodes were happening on a daily basis. She did not have any other associated symptoms. She has otherwise felt well with the exception of a chronic sore throat for which she was recently started on ranitidine by her PCP.  At age 26, Ashley Patel passed out unexpectedly while driving. She did not have any warning symptoms such as palpitations, lightheadedness, or chest pain. She reports being evaluated with an event monitor and exercise tolerance test, which she believes were normal. She was given a diagnosis of QT prolongation and was told to avoid roller coasters. She was not started on any medications and has not followed with a cardiologist since then. She denies a family history of heart disease, including sudden cardiac death or unexplained syncope. She drinks 3-4 caffeinated sodas a day, typically on days when she is not at work. She also drinks tea from time to time.  --------------------------------------------------------------------------------------------------  Cardiovascular History &  Procedures: Cardiovascular Problems:  Palpitations and elevated resting heart rate  Question QT prolongation  Risk Factors:  None  Cath/PCI:  None  CV Surgery:  None  EP Procedures and Devices:  None  Non-Invasive Evaluation(s):  Patient reports negative ETT and monitor at ate 17 (results not available; patient does not recall where tests were performed).  Recent CV Pertinent Labs: Lab Results  Component Value Date   CHOL 158 09/19/2016   HDL 54 09/19/2016   LDLCALC 95 09/19/2016   TRIG 46 09/19/2016   CHOLHDL 2.9 09/19/2016   K 4.7 03/19/2017   BUN 15 03/19/2017   BUN 12 04/26/2016   CREATININE 0.80 03/19/2017    --------------------------------------------------------------------------------------------------  Past Medical History:  Diagnosis Date  . Acne   . Anxiety   . Asthma   . Breast lump on right side at 12 o'clock position   . Depression   . Fatigue   . Hyperglycemia   . Insomnia   . Irritable bowel syndrome   . Numerous moles   . Vitamin B 12 deficiency   . Vitamin D deficiency     Past Surgical History:  Procedure Laterality Date  . CHOLECYSTECTOMY    . TONSILLECTOMY AND ADENOIDECTOMY      Outpatient Encounter Prescriptions as of 03/20/2017  Medication Sig  . Cyanocobalamin (VITAMIN B-12) 1000 MCG SUBL Place 1 tablet under the tongue daily.  . DULoxetine (CYMBALTA) 60 MG capsule Take 1 capsule (60 mg total) by mouth daily.  . Levonorgestrel-Ethinyl Estradiol (AMETHIA,CAMRESE) 0.15-0.03 &0.01 MG tablet Take 1 tablet by mouth daily.  . ranitidine (ZANTAC) 150 MG capsule Take 1 capsule (150 mg total) by mouth 2 (two) times daily.   No facility-administered encounter medications on file  as of 03/20/2017.     Allergies: Amoxil [amoxicillin] and Cymbalta [duloxetine hcl]  Social History   Social History  . Marital status: Married    Spouse name: N/A  . Number of children: N/A  . Years of education: N/A   Occupational History  .  Not on file.   Social History Main Topics  . Smoking status: Former Smoker    Packs/day: 0.50    Years: 5.00    Types: Cigarettes    Start date: 12/04/2003    Quit date: 11/04/2009  . Smokeless tobacco: Never Used  . Alcohol use 0.0 oz/week     Comment: occasionally  . Drug use: No  . Sexual activity: Yes    Partners: Male    Birth control/ protection: Implant   Other Topics Concern  . Not on file   Social History Narrative  . No narrative on file    Family History  Problem Relation Age of Onset  . Cardiomyopathy Mother   . Macrocephaly Son     Review of Systems: A 12-system review of systems was performed and was negative except as noted in the HPI.  --------------------------------------------------------------------------------------------------  Physical Exam: BP 122/68 (BP Location: Right Arm, Patient Position: Sitting, Cuff Size: Normal)   Pulse 77   Ht 5\' 9"  (1.753 m)   Wt 180 lb 12 oz (82 kg)   BMI 26.69 kg/m   General:  Well-developed, well-nourished woman, seated comfortably in the exam room. HEENT: No conjunctival pallor or scleral icterus.  Moist mucous membranes.  OP clear. Neck: Supple without lymphadenopathy, thyromegaly, JVD, or HJR.  No carotid bruit. Lungs: Normal work of breathing.  Clear to auscultation bilaterally without wheezes or crackles. Heart: Regular rate and rhythm without murmurs, rubs, or gallops.  Non-displaced PMI. Abd: Bowel sounds present.  Soft, NT/ND without hepatosplenomegaly Ext: No lower extremity edema.  Radial, PT, and DP pulses are 2+ bilaterally Skin: warm and dry without rash Neuro: CNIII-XII intact.  Strength and fine-touch sensation intact in upper and lower extremities bilaterally. Psych: Normal mood and affect.  EKG:  Normal sinus rhythm without significant abnormalities. QTc is 450 ms. No prior tracing is available for comparison.  Lab Results  Component Value Date   WBC 8.3 03/19/2017   HGB 12.8 03/19/2017    HCT 38.8 03/19/2017   MCV 84.3 03/19/2017   PLT 396 03/19/2017    Lab Results  Component Value Date   NA 140 03/19/2017   K 4.7 03/19/2017   CL 105 03/19/2017   CO2 26 03/19/2017   BUN 15 03/19/2017   CREATININE 0.80 03/19/2017   GLUCOSE 82 03/19/2017   ALT 14 03/19/2017    Lab Results  Component Value Date   CHOL 158 09/19/2016   HDL 54 09/19/2016   LDLCALC 95 09/19/2016   TRIG 46 09/19/2016   CHOLHDL 2.9 09/19/2016    --------------------------------------------------------------------------------------------------  ASSESSMENT AND PLAN: Palpitations, lightheadedness, and elevated resting heart rate Symptoms are nonspecific, with palpitations having been present only for 2 weeks around Christmas. The patient wonders if this was related to stress. Her resting heart rate today is normal at 77 beats per minutes. Her EKG is unremarkable with a QTc of 450 ms. This is slightly longer than normal but not significantly prolonged. Recent labs showed normal potassium and TSH. I have encouraged the patient to decrease and ideally stop caffeine consumption and to increase hydration with water or Gatorade. We will obtain a transthoracic echocardiogram and 14-day event monitor for further evaluation.  Given possible history of QT prolongation, QT prolonging medications should be avoided if possible.  Follow-up: Return to clinic in 1 month.  Nelva Bush, MD 03/20/2017 10:37 AM

## 2017-03-21 LAB — CULTURE, GROUP A STREP

## 2017-03-22 ENCOUNTER — Other Ambulatory Visit: Payer: Self-pay | Admitting: Family Medicine

## 2017-03-22 MED ORDER — VITAMIN D (ERGOCALCIFEROL) 1.25 MG (50000 UNIT) PO CAPS: 50000.0000 [IU] | ORAL_CAPSULE | ORAL | 0 refills | Status: DC

## 2017-04-03 DIAGNOSIS — R002 Palpitations: Secondary | ICD-10-CM

## 2017-04-03 DIAGNOSIS — R42 Dizziness and giddiness: Secondary | ICD-10-CM | POA: Diagnosis not present

## 2017-04-18 ENCOUNTER — Other Ambulatory Visit: Payer: Self-pay

## 2017-04-23 ENCOUNTER — Telehealth: Payer: Self-pay | Admitting: Internal Medicine

## 2017-04-23 NOTE — Telephone Encounter (Signed)
Left detailed voicemail message to check and see if she has mailed monitor back in and to call if any questions.

## 2017-04-24 ENCOUNTER — Ambulatory Visit: Payer: Self-pay | Admitting: Internal Medicine

## 2017-04-25 NOTE — Telephone Encounter (Signed)
Attempted to reach patient. Left detailed message, ok per DPR, making sure for her to mail in her ZIO monitor so we may get results.  As well as suggested for her to call to reschedule her echo and follow up appt at her earliest convenience.

## 2017-05-01 NOTE — Telephone Encounter (Signed)
No answer. Left detailed message, ok per DPR, that we received notification from [employer] that monitor had not been received and to please call us to let us know if she has or has not mailed it as well as to schedule necessary f/u.

## 2017-05-02 ENCOUNTER — Encounter: Payer: Self-pay | Admitting: *Deleted

## 2017-05-02 NOTE — Telephone Encounter (Signed)
No answer. Left message to call back.  Letter mailed to patient to contact office.

## 2017-06-07 ENCOUNTER — Encounter: Payer: Self-pay | Admitting: Internal Medicine

## 2017-07-12 ENCOUNTER — Ambulatory Visit: Payer: 59 | Admitting: Family Medicine

## 2017-09-24 ENCOUNTER — Encounter: Payer: 59 | Admitting: Family Medicine

## 2018-02-20 ENCOUNTER — Ambulatory Visit: Payer: Self-pay | Admitting: *Deleted

## 2018-02-20 NOTE — Telephone Encounter (Signed)
Called in from work c/o having an anxiety attack now.  These have been increasing in frequency over the past 2 weeks.   She has woken up at night the last 3 nights with anxiety  She denies having thoughts of suicide or of harming herself or others.  She denies any events at work or at home that would be contributing to these.   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  Denies problems at home with relationship or kids.   "I'm happily married actually".     I made her an appt with Raelyn Ensign, FNP-C because her PCP Dr. Ancil Boozer is booked.   Appt is for 02/21/18 at 8:00am.  Instructed her to call us back if she needed [place].   She verbalized understanding and was agreeable to this plan.   She mentioned,  "I feel much better after talking with you".  " Thank  you so much for your time and help".    Reason for Disposition . Symptoms interfere with sleep  Answer Assessment - Initial Assessment Questions 1. CONCERN: "What happened that made you call today?"     I had a panic attack before.  Over the past week I've had about 9 panic attacks.   I work at a [employer] and I'm afraid I'm going to loss my job. 2. ANXIETY SYMPTOM SCREENING: "Can you describe how you have been feeling?"  (e.g., tense, restless, panicky, anxious, keyed up, trouble sleeping, trouble concentrating)     I'm been having more panic attacks.   The last 3  nights I've woken up with panic attacks.   I was able to go back to sleep after an hour.   3. ONSET: "How long have you been feeling this way?"     It's been about 2 weeks.    But it's getting worse.   It's waking me up at night it's gotten so bad.    Dr. Ancil Boozer has ordered medicine for me in the past but I didn't take it.    4. RECURRENT: "Have you felt this way before?"  If yes: "What happened that time?" "What helped these feelings go away in the past?"      No really.    I think I have a generalized anxiety disorder. 5. RISK OF  HARM - SUICIDAL IDEATION:  "Do you ever have thoughts of hurting or killing yourself?"  (e.g., yes, no, no but preoccupation with thoughts about death)   - INTENT:  "Do you have thoughts of hurting or killing yourself right NOW?" (e.g., yes, no, N/A)   - PLAN: "Do you have a specific plan for how you would do this?" (e.g., gun, knife, overdose, no plan, N/A)     No 6. RISK OF HARM - HOMICIDAL IDEATION:  "Do you ever have thoughts of hurting or killing someone else?"  (e.g., yes, no, no but preoccupation with thoughts about death)   - INTENT:  "Do you have thoughts of hurting or killing someone right NOW?" (e.g., yes, no, N/A)   - PLAN: "Do you have a specific plan for how you would do this?" (e.g., gun, knife, no plan, N/A)      No 7. FUNCTIONAL IMPAIRMENT: "How have things been going for you overall in your life? Have you had any more difficulties than usual doing your normal daily activities?"  (e.g., better, same, worse; self-care, school, work, interactions)  No triggers. 8. 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   They are very supportive. 9. THERAPIST: "Do you have a counselor or therapist? Name?"     I'm thinking about doing that.   I work 8-5 so it's hard schedule. 10. STRESSORS: "Has there been any new stress or recent changes in your life?"       No 11. CAFFEINE ABUSE: "Do you drink caffeinated beverages, and how much each day?" (e.g., coffee, tea, colas)       I drink a lot of tea and soda.   I drink throughout the day but not in morning.   12. SUBSTANCE ABUSE: "Do you use any illegal drugs or alcohol?"       No alcohol or drugs 13. OTHER SYMPTOMS: "Do you have any other physical symptoms right now?" (e.g., chest pain, palpitations, difficulty breathing, fever)       No.   My heart feels like it's racing.   Feeling of panic.   I just want to run. 14. PREGNANCY: "Is there  any chance you are pregnant?" "When was your last menstrual period?"       No.  Very unlikely but possible.  Protocols used: ANXIETY AND PANIC ATTACK-A-AH

## 2018-02-21 ENCOUNTER — Ambulatory Visit (INDEPENDENT_AMBULATORY_CARE_PROVIDER_SITE_OTHER): Payer: 59 | Admitting: Family Medicine

## 2018-02-21 ENCOUNTER — Ambulatory Visit: Payer: Self-pay | Admitting: Family Medicine

## 2018-02-21 ENCOUNTER — Encounter: Payer: Self-pay | Admitting: Family Medicine

## 2018-02-21 VITALS — BP 110/70 | HR 99 | Temp 98.4°F | Resp 16 | Ht 69.0 in | Wt 176.8 lb

## 2018-02-21 DIAGNOSIS — F411 Generalized anxiety disorder: Secondary | ICD-10-CM | POA: Diagnosis not present

## 2018-02-21 DIAGNOSIS — R002 Palpitations: Secondary | ICD-10-CM

## 2018-02-21 DIAGNOSIS — F410 Panic disorder [episodic paroxysmal anxiety] without agoraphobia: Secondary | ICD-10-CM | POA: Diagnosis not present

## 2018-02-21 DIAGNOSIS — F330 Major depressive disorder, recurrent, mild: Secondary | ICD-10-CM | POA: Diagnosis not present

## 2018-02-21 MED ORDER — HYDROXYZINE HCL 10 MG PO TABS: 10.0000 mg | ORAL_TABLET | Freq: Three times a day (TID) | ORAL | 0 refills | Status: DC | PRN

## 2018-02-21 MED ORDER — DULOXETINE HCL 60 MG PO CPEP: 60.0000 mg | ORAL_CAPSULE | Freq: Every day | ORAL | 0 refills | Status: DC

## 2018-02-21 MED ORDER — DULOXETINE HCL 30 MG PO CPEP: 30.0000 mg | ORAL_CAPSULE | Freq: Every day | ORAL | 0 refills | Status: DC

## 2018-02-21 NOTE — Patient Instructions (Addendum)
- Take 30mg  Cymbalta for 7 days. Then start taking the 60mg  Cymbalta once daily. - You may take Hydroxyzine as needed up to every 8 hours, 20 tablets to last 30 days.  This medication will likely make you drowsy, please take in a safe place where you are not operating any machinery or caring for children.  - Psychology Today Find a Therapist - https://www.psychologytoday.com/us/therapists/ - Employee Assistance Program with [facility] is also a great option.  Here are some resources to help you if you feel you are in a mental health crisis:  [facility] - Call [phone]  for help - Website with more resources: GripTrip.com.pt  [facility] - Call (678)445-1978 for help. - Mobile Crisis Program available 24 hours a day, 365 days a year. - Available for anyone of any age in [place] counties.  [facility] - Address: [address], [place] - Telephone: [phone]  - Hours of Operation: Sunday - Saturday - 8:00 a.m. - 8:00 p.m. - Medicaid, Medicare (Government Issued Only), BCBS, and [employer], [place], Psychiatrists on-site to provide medication management, [facility], and Peer Support Care.  [facility] - Call [phone] for help. - Mobile Crisis Program available 24 hours a day, 365 days a year. - Available for anyone of any age in [place]   12 Ways to Curb Anxiety  ?Anxiety is normal human sensation. It is what helped our ancestors survive the pitfalls of the wilderness. Anxiety is defined as experiencing worry or nervousness about an imminent event or something with an uncertain outcome. It is a feeling experienced by most people at some point in their lives. Anxiety can be triggered by a very personal issue, such as the illness of a loved one, or an  event of global proportions, such as a refugee crisis. Some of the symptoms of anxiety are:  Feeling restless.  Having a feeling of impending danger.  Increased heart rate.  Rapid breathing. Sweating.  Shaking.  Weakness or feeling tired.  Difficulty concentrating on anything except the current worry.  Insomnia.  Stomach or bowel problems. What can we do about anxiety we may be feeling? There are many techniques to help manage stress and relax. Here are 12 ways you can reduce your anxiety almost immediately: 1. Turn off the constant feed of information. Take a social media sabbatical. Studies have shown that social media directly contributes to social anxiety.  2. Monitor your television viewing habits. Are you watching shows that are also contributing to your anxiety, such as 24-hour news stations? Try watching something else, or better yet, nothing at all. Instead, listen to music, read an inspirational book or practice a hobby. 3. Eat nutritious meals. Also, don't skip meals and keep healthful snacks on hand. Hunger and poor diet contributes to feeling anxious. 4. Sleep. Sleeping on a regular schedule for at least seven to eight hours a night will do wonders for your outlook when you are awake. 5. Exercise. Regular exercise will help rid your body of that anxious energy and help you get more restful sleep. 6. Try deep (diaphragmatic) breathing. Inhale slowly through your nose for five seconds and exhale through your mouth. 7. Practice acceptance and gratitude. When anxiety hits, accept that there are things out of your control that shouldn't be of immediate concern.  8. Seek out humor. When anxiety strikes, watch a funny video,  read jokes or call a friend who makes you laugh. Laughter is healing for our bodies and releases endorphins that are calming. 9. Stay positive. Take the effort to replace negative thoughts with positive ones. Try to see a stressful situation in a positive light. Try to  come up with solutions rather than dwelling on the problem. 10. Figure out what triggers your anxiety. Keep a journal and make note of anxious moments and the events surrounding them. This will help you identify triggers you can avoid or even eliminate. 11. Talk to someone. Let a trusted friend, family member or even trained professional know that you are feeling overwhelmed and anxious. Verbalize what you are feeling and why.  12. Volunteer. If your anxiety is triggered by a crisis on a large scale, become an advocate and work to resolve the problem that is causing you unease. Anxiety is often unwelcome and can become overwhelming. If not kept in check, it can become a disorder that could require medical treatment. However, if you take the time to care for yourself and avoid the triggers that make you anxious, you will be able to find moments of relaxation and clarity that make your life much more enjoyable.

## 2018-02-21 NOTE — Addendum Note (Signed)
Addended by: Hubbard Hartshorn on: 02/21/2018 08:40 AM   Modules accepted: Orders

## 2018-02-21 NOTE — Progress Notes (Addendum)
Name: Ashley Patel   MRN: [id number]    DOB: Sep 23, 1981   Date:02/21/2018       Progress Note  Subjective  Chief Complaint  Chief Complaint  Patient presents with  . Panic Attack    HPI  Pt presents with concern for panic attacks intermittently over the last two weeks. Over the last week she has awoken from sleep having a panic attack twice - increased heartrate, anxiety, etc. She used breathing exercises and self-calming, and was able to fall back asleep.  She has also been having panic attacks at work/during the day.  She stopped taking Cymbalta 60mg  about 3-4 months or longer.  - She notes that she has had a long history of taking medication, doing well on it, coming off on her own, and then having an exacerbation of her anxiety.  She says she knows that she should stay on her medications to help her symptoms, but she gets to the point sometimes where she does not feel that she needs it anymore.  Today she feels ready to remain compliant and is interested in seeking counseling.  She has had multiple panic attacks over the last few days.  Symptoms have included palpitations, feelings of heightened nervousness, and occasional chest tightness which goes away after she is able to calm down. Denies SI/HI or hallucination. 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  Has a wonderful marriage with her husband and great friends/family support.  GAD 7 : Generalized Anxiety Score 02/21/2018 03/19/2017  Nervous, Anxious, on Edge 3 2  Control/stop worrying 3 1  Worry too much - different things 3 2  Trouble relaxing 3 3  Restless 0 0  Easily annoyed or irritable 0 3  Afraid - awful might happen 3 1  Total GAD 7 Score 15 12  Anxiety Difficulty Very difficult Somewhat difficult    Depression screen [facility] 2/9 02/21/2018 03/19/2017 09/12/2016 06/11/2016 04/26/2016  Decreased Interest 2 0 2 0 0   Down, Depressed, Hopeless 2 0 2 0 0  PHQ - 2 Score 4 0 4 0 0  Altered sleeping 1 - 2 - -  Tired, decreased energy 1 - 1 - -  Change in appetite 0 - 1 - -  Feeling bad or failure about yourself  0 - 0 - -  Trouble concentrating 1 - 0 - -  Moving slowly or fidgety/restless 0 - 0 - -  Suicidal thoughts 0 - 0 - -  PHQ-9 Score 7 - 8 - -  Difficult doing work/chores Very difficult - Somewhat difficult - -    Patient Active Problem List   Diagnosis Date Noted  . Panic attack 08/11/2015  . B12 deficiency 08/05/2015  . Acne 08/05/2015  . Blood glucose elevated 08/05/2015  . IBS (irritable bowel syndrome) 08/05/2015  . Numerous moles 08/05/2015  . Vitamin D deficiency 08/05/2015  . Family history of thyroid disease 08/05/2015  . Asthma without status asthmaticus 06/16/2010  . Anxiety and depression 06/16/2010    Social History   Tobacco Use  . Smoking status: Former Smoker    Packs/day: 0.50    Years: 5.00    Pack years: 2.50    Types: Cigarettes    Start date: 12/04/2003    Last attempt to quit: 11/04/2009    Years since quitting: 8.3  . Smokeless tobacco: Never Used  Substance Use Topics  . Alcohol use: Yes    Alcohol/week: 0.0 oz    Comment: Drink 2x per year     Current Outpatient Medications:  .  Cyanocobalamin (VITAMIN B-12) 1000 MCG SUBL, Place 1 tablet under the tongue daily., Disp: , Rfl:  .  DULoxetine (CYMBALTA) 60 MG capsule, Take 1 capsule (60 mg total) by mouth daily. (Patient not taking: Reported on 02/21/2018), Disp: 90 capsule, Rfl: 1 .  Levonorgestrel-Ethinyl Estradiol (AMETHIA,CAMRESE) 0.15-0.03 &0.01 MG tablet, Take 1 tablet by mouth daily., Disp: , Rfl:  .  ranitidine (ZANTAC) 150 MG capsule, Take 1 capsule (150 mg total) by mouth 2 (two) times daily. (Patient not taking: Reported on 02/21/2018), Disp: 90 capsule, Rfl: 0 .  Vitamin D, Ergocalciferol, (DRISDOL) 50000 units CAPS capsule, Take 1 capsule (50,000 Units total) by mouth every 7 (seven) days.  (Patient not taking: Reported on 02/21/2018), Disp: 12 capsule, Rfl: 0  Allergies  Allergen Reactions  . Amoxil [Amoxicillin]     Severe yeast     ROS  Constitutional: Negative for fever or weight change.  Respiratory: Negative for cough and shortness of breath.   Cardiovascular: Negative for chest pain; positive for intermittent palpitations during panic attacks.  Gastrointestinal: Negative for abdominal pain, no bowel changes.  Musculoskeletal: Negative for gait problem or joint swelling.  Skin: Negative for rash.  Neurological: Negative for dizziness or headache.  No other specific complaints in a complete review of systems (except as listed in HPI above).  Objective  Vitals:   02/21/18 0810  BP: 110/70  Pulse: 99  Resp: 16  Temp: 98.4 F (36.9 C)  TempSrc: Oral  SpO2: 97%  Weight: 176 lb 12.8 oz (80.2 kg)  Height: 5\' 9"  (1.753 m)   Body mass index is 26.11 kg/m.  Nursing Note and Vital Signs reviewed.  Physical Exam  Constitutional: Patient appears well-developed and well-nourished.  No distress.  HEENT: head atraumatic, normocephalic Cardiovascular: Normal rate, regular rhythm, S1/S2 present.  No murmur or rub heard. No BLE edema. Pulmonary/Chest: Effort normal and breath sounds clear. No respiratory distress or retractions. Psychiatric: Patient has a appropriate mood and affect. behavior is normal. Judgment and thought content normal.  No results found for this or any previous visit (from the past 72 hour(s)).  Assessment & Plan  1. GAD (generalized anxiety disorder) - hydrOXYzine (ATARAX/VISTARIL) 10 MG tablet; Take 1 tablet (10 mg total) by mouth every 8 (eight) hours as needed for anxiety. To last 30 days.  Dispense: 20 tablet; Refill: 0 - DULoxetine (CYMBALTA) 30 MG capsule; Take 1 capsule (30 mg total) by mouth daily.  Dispense: 7 capsule; Refill: 0 - DULoxetine (CYMBALTA) 60 MG capsule; Take 1 capsule (60 mg total) by mouth daily.  Dispense: 30 capsule;  Refill: 0 - See AVS regarding discussion on managing anxiety.  Also provided website for psychology today therapist finder.  2. Mild recurrent major depression (HCC) - DULoxetine (CYMBALTA) 30 MG capsule; Take 1 capsule (30 mg total) by mouth daily.  Dispense: 7 capsule; Refill: 0 - DULoxetine (CYMBALTA) 60 MG capsule; Take 1 capsule (60 mg total) by mouth daily.  Dispense: 30 capsule; Refill: 0  3. Panic attacks - hydrOXYzine (ATARAX/VISTARIL) 10 MG tablet; Take 1 tablet (10 mg total) by mouth every 8 (eight) hours as needed for anxiety. To last 30 days.  Dispense: 20 tablet; Refill: 0  4. Palpitations - TSH - Basic Metabolic Panel (BMET) - CBC - PT declines EKG today, had cardiac work-up last  year including holter monitor that was negative for abnormal findings.  She would like to do labs as above.  -Return in about 2 weeks (around 03/07/2018) for Anxiety Follow Up. -Red flags and when to present for emergency care or RTC including fever >101.29F, chest pain, shortness of breath, new/worsening/un-resolving symptoms, reviewed with patient at time of visit. Follow up and care instructions discussed and provided in AVS.

## 2018-03-07 ENCOUNTER — Ambulatory Visit: Payer: 59 | Admitting: Family Medicine

## 2018-03-07 ENCOUNTER — Ambulatory Visit: Payer: 59 | Admitting: Nurse Practitioner

## 2018-03-14 ENCOUNTER — Ambulatory Visit (INDEPENDENT_AMBULATORY_CARE_PROVIDER_SITE_OTHER): Payer: 59 | Admitting: Nurse Practitioner

## 2018-03-14 ENCOUNTER — Encounter: Payer: Self-pay | Admitting: Nurse Practitioner

## 2018-03-14 DIAGNOSIS — F411 Generalized anxiety disorder: Secondary | ICD-10-CM

## 2018-03-14 DIAGNOSIS — F410 Panic disorder [episodic paroxysmal anxiety] without agoraphobia: Secondary | ICD-10-CM | POA: Diagnosis not present

## 2018-03-14 DIAGNOSIS — F330 Major depressive disorder, recurrent, mild: Secondary | ICD-10-CM

## 2018-03-14 DIAGNOSIS — K219 Gastro-esophageal reflux disease without esophagitis: Secondary | ICD-10-CM | POA: Insufficient documentation

## 2018-03-14 MED ORDER — RANITIDINE HCL 150 MG PO CAPS: 150.0000 mg | ORAL_CAPSULE | Freq: Two times a day (BID) | ORAL | 0 refills | Status: DC

## 2018-03-14 MED ORDER — DULOXETINE HCL 60 MG PO CPEP: 60.0000 mg | ORAL_CAPSULE | Freq: Every day | ORAL | 1 refills | Status: DC

## 2018-03-14 MED ORDER — HYDROXYZINE HCL 10 MG PO TABS: 10.0000 mg | ORAL_TABLET | Freq: Three times a day (TID) | ORAL | 0 refills | Status: DC | PRN

## 2018-03-14 NOTE — Patient Instructions (Signed)
-   [email or web address]   Gastroesophageal Reflux Disease, Adult Normally, food travels down the esophagus and stays in the stomach to be digested. If a person has gastroesophageal reflux disease (GERD), food and stomach acid move back up into the esophagus. When this happens, the esophagus becomes sore and swollen (inflamed). Over time, GERD can make small holes (ulcers) in the lining of the esophagus. Follow these instructions at home: Diet  Follow a diet as told by your doctor. You may need to avoid foods and drinks such as: ? Coffee and tea (with or without caffeine). ? Drinks that contain alcohol. ? Energy drinks and sports drinks. ? Carbonated drinks or sodas. ? Chocolate and cocoa. ? Peppermint and mint flavorings. ? Garlic and onions. ? Horseradish. ? Spicy and acidic foods, such as peppers, chili powder, curry powder, vinegar, hot sauces, and BBQ sauce. ? Citrus fruit juices and citrus fruits, such as oranges, lemons, and limes. ? Tomato-based foods, such as red sauce, chili, salsa, and pizza with red sauce. ? Fried and fatty foods, such as donuts, french fries, potato chips, and high-fat dressings. ? High-fat meats, such as hot dogs, rib eye steak, sausage, ham, and bacon. ? High-fat dairy items, such as whole milk, butter, and cream cheese.  Eat small meals often. Avoid eating large meals.  Avoid drinking large amounts of liquid with your meals.  Avoid eating meals during the 2-3 hours before bedtime.  Avoid lying down right after you eat.  Do not exercise right after you eat. General instructions  Pay attention to any changes in your symptoms.  Take over-the-counter and prescription medicines only as told by your doctor. Do not take aspirin, ibuprofen, or other NSAIDs unless your doctor says it is okay.  Do not use any tobacco products, including cigarettes, chewing tobacco, and e-cigarettes. If you need help quitting, ask your doctor.  Wear loose clothes. Do not  wear anything tight around your waist.  Raise (elevate) the head of your bed about 6 inches (15 cm).  Try to lower your stress. If you need help doing this, ask your doctor.  If you are overweight, lose an amount of weight that is healthy for you. Ask your doctor about a safe weight loss goal.  Keep all follow-up visits as told by your doctor. This is important. Contact a doctor if:  You have new symptoms.  You lose weight and you do not know why it is happening.  You have trouble swallowing, or it hurts to swallow.  You have wheezing or a cough that keeps happening.  Your symptoms do not get better with treatment.  You have a hoarse voice. Get help right away if:  You have pain in your arms, neck, jaw, teeth, or back.  You feel sweaty, dizzy, or light-headed.  You have chest pain or shortness of breath.  You throw up (vomit) and your throw up looks like blood or coffee grounds.  You pass out (faint).  Your poop (stool) is bloody or black.  You cannot swallow, drink, or eat. This information is not intended to replace advice given to you by your health care provider. Make sure you discuss any questions you have with your health care provider. Document Released: 05/07/2008 Document Revised: 04/26/2016 Document Reviewed: 03/16/2015 Elsevier Interactive Patient Education  [employer].

## 2018-03-14 NOTE — Progress Notes (Addendum)
Name: Ashley Patel   MRN: [id number]    DOB: Dec 01, 1981   Date:03/14/2018       Progress Note  Subjective  Chief Complaint  Chief Complaint  Patient presents with  . Follow-up    2 week recheck medication    HPI GERD Patient notes acid-reflux- burning worse at night.  Anxiety/Depression Patient presents today for 2-week follow-up after being restarted on Cymbalta at last visit on 02/21/2018 by Raquel Sarna NP.  Patient also prescribed hydroxyzine at that visit due to having intermittent panic attacks. States she feels so much better right now. Patient states has had three panic attacks since the last visit- two the first week, one last week and none this week. Patient took the hydroxyzine two episodes- put her to sleep which helped her. She does note she is a little more restless but everything else has improved. She drinks one soda a day. States when she was younger she was more depressed than anxious but it switched when she got older. Feels cymbalta is helping with the anxiety and depression. States she has noticed has less of an appetite that has been ongoing for a month has related it to her depression. Patient states her sleeping has improved since before medications- and sleeps through the night but gets easily tired mid-day.   GAD 7 : Generalized Anxiety Score 03/14/2018 02/21/2018 03/19/2017  Nervous, Anxious, on Edge 2 3 2   Control/stop worrying 2 3 1   Worry too much - different things 2 3 2   Trouble relaxing 2 3 3   Restless 1 0 0  Easily annoyed or irritable 0 0 3  Afraid - awful might happen 2 3 1   Total GAD 7 Score 11 15 12   Anxiety Difficulty - Very difficult Somewhat difficult   Depression screen [facility] 2/9 03/14/2018 02/21/2018 03/19/2017  Decreased Interest 1 2 0  Down, Depressed, Hopeless 1 2 0  PHQ - 2 Score 2 4 0  Altered sleeping 2 1 -  Tired, decreased energy 3 1 -  Change in appetite 2 0 -  Feeling bad or failure about yourself  0 0 -  Trouble concentrating 1 1 -  Moving  slowly or fidgety/restless 0 0 -  Suicidal thoughts 0 0 -  PHQ-9 Score 10 7 -  Difficult doing work/chores - Very difficult -      Patient Active Problem List   Diagnosis Date Noted  . Panic attack 08/11/2015  . B12 deficiency 08/05/2015  . Acne 08/05/2015  . Blood glucose elevated 08/05/2015  . IBS (irritable bowel syndrome) 08/05/2015  . Numerous moles 08/05/2015  . Vitamin D deficiency 08/05/2015  . Family history of thyroid disease 08/05/2015  . Asthma without status asthmaticus 06/16/2010  . Anxiety and depression 06/16/2010    Past Medical History:  Diagnosis Date  . Acne   . Anxiety   . Asthma   . Breast lump on right side at 12 o'clock position   . Depression   . Fatigue   . Hyperglycemia   . Insomnia   . Irritable bowel syndrome   . Numerous moles   . Vitamin B 12 deficiency   . Vitamin D deficiency     Past Surgical History:  Procedure Laterality Date  . CHOLECYSTECTOMY    . TONSILLECTOMY AND ADENOIDECTOMY      Social History   Tobacco Use  . Smoking status: Former Smoker    Packs/day: 0.50    Years: 5.00    Pack years: 2.50  Types: Cigarettes    Start date: 12/04/2003    Last attempt to quit: 11/04/2009    Years since quitting: 8.3  . Smokeless tobacco: Never Used  Substance Use Topics  . Alcohol use: Yes    Alcohol/week: 0.0 oz    Comment: Drink 2x per year     Current Outpatient Medications:  .  Cyanocobalamin (VITAMIN B-12) 1000 MCG SUBL, Place 1 tablet under the tongue daily., Disp: , Rfl:  .  DULoxetine (CYMBALTA) 30 MG capsule, Take 1 capsule (30 mg total) by mouth daily., Disp: 7 capsule, Rfl: 0 .  DULoxetine (CYMBALTA) 60 MG capsule, Take 1 capsule (60 mg total) by mouth daily., Disp: 30 capsule, Rfl: 0 .  hydrOXYzine (ATARAX/VISTARIL) 10 MG tablet, Take 1 tablet (10 mg total) by mouth every 8 (eight) hours as needed for anxiety. To last 30 days., Disp: 20 tablet, Rfl: 0 .  ranitidine (ZANTAC) 150 MG capsule, Take 1 capsule (150 mg  total) by mouth 2 (two) times daily., Disp: 90 capsule, Rfl: 0 .  Levonorgestrel-Ethinyl Estradiol (AMETHIA,CAMRESE) 0.15-0.03 &0.01 MG tablet, Take 1 tablet by mouth daily., Disp: , Rfl:  .  Vitamin D, Ergocalciferol, (DRISDOL) 50000 units CAPS capsule, Take 1 capsule (50,000 Units total) by mouth every 7 (seven) days. (Patient not taking: Reported on 02/21/2018), Disp: 12 capsule, Rfl: 0  Allergies  Allergen Reactions  . Amoxil [Amoxicillin]     Severe yeast     ROS  No other specific complaints in a complete review of systems (except as listed in HPI above).  Objective  Vitals:   03/14/18 1554  BP: 122/70  Pulse: 100  Resp: 16  Temp: 98.5 F (36.9 C)  TempSrc: Oral  SpO2: 96%  Weight: 176 lb 9.6 oz (80.1 kg)  Height: 5\' 9"  (1.753 m)    Body mass index is 26.08 kg/m.  Nursing Note and Vital Signs reviewed.  Physical Exam   Constitutional: Patient appears well-developed and well-nourished.  Cardiovascular: Normal rate, regular rhythm, S1/S2 present.   Pulmonary/Chest: Effort normal and breath sounds clear.  Psychiatric: Patient has a normal mood and affect. behavior is normal. Judgment and thought content normal.  No results found for this or any previous visit (from the past 72 hour(s)).  Assessment & Plan  1. GAD (generalized anxiety disorder) - DULoxetine (CYMBALTA) 60 MG capsule; Take 1 capsule (60 mg total) by mouth daily.  Dispense: 90 capsule; Refill: 1 - hydrOXYzine (ATARAX/VISTARIL) 10 MG tablet; Take 1 tablet (10 mg total) by mouth every 8 (eight) hours as needed for anxiety. To last 30 days.  Dispense: 20 tablet; Refill: 0  2. Mild recurrent major depression (HCC) - DULoxetine (CYMBALTA) 60 MG capsule; Take 1 capsule (60 mg total) by mouth daily.  Dispense: 90 capsule; Refill: 1  3. Panic attacks - hydrOXYzine (ATARAX/VISTARIL) 10 MG tablet; Take 1 tablet (10 mg total) by mouth every 8 (eight) hours as needed for anxiety. To last 30 days.  Dispense: 20  tablet; Refill: 0  4. Gastroesophageal reflux disease without esophagitis - ranitidine (ZANTAC) 150 MG capsule; Take 1 capsule (150 mg total) by mouth 2 (two) times daily.  Dispense: 90 capsule; Refill: 0    -Red flags and when to present for emergency care or RTC including fever >101.56F, chest pain, shortness of breath, new/worsening/un-resolving symptoms, reviewed with patient at time of visit. Follow up and care instructions discussed and provided in AVS. -Reviewed Health Maintenance: will schedule for physical and papsmear  ------------------------------------------- I have reviewed  this encounter including the documentation in this note and/or discussed this patient with the provider, Suezanne Cheshire DNP AGNP-C. I am certifying that I agree with the content of this note as supervising physician. Enid Derry, [facility] 03/15/2018, 1:50 PM

## 2018-05-28 ENCOUNTER — Ambulatory Visit: Payer: 59 | Admitting: Nurse Practitioner

## 2018-06-27 ENCOUNTER — Encounter: Payer: 59 | Admitting: Family Medicine

## 2018-09-01 ENCOUNTER — Ambulatory Visit: Payer: Self-pay

## 2018-09-01 NOTE — Telephone Encounter (Signed)
Please review

## 2018-09-01 NOTE — Telephone Encounter (Signed)
Pt. Reports she started herself on Cymbalta 3 weeks ago and is not feeling any better. Reports she feels depressed and doesn't know why. " I have a wonderful, supportive husband and great kids. My job is going well, too." Denies suicidal thoughts, "but I could see where I could go that way." Appointment made for tomorrow. Instructed pt. If she develops suicidal thoughts to go to ED for evaluation. Verbalizes understanding. Reason for Disposition . Symptoms interfere with work or school  Answer Assessment - Initial Assessment Questions 1. CONCERN: "What happened that made you call today?"     Feeling depression 2. DEPRESSION SYMPTOM SCREENING: "How are you feeling overall?" (e.g., decreased energy, increased sleeping or difficulty sleeping, difficulty concentrating, feelings of sadness, guilt, hopelessness, or worthlessness)     Depression 3. RISK OF HARM - SUICIDAL IDEATION:  "Do you ever have thoughts of hurting or killing yourself?"  (e.g., yes, no, no but preoccupation with thoughts about death)   - INTENT:  "Do you have thoughts of hurting or killing yourself right NOW?" (e.g., yes, no, N/A)   - PLAN: "Do you have a specific plan for how you would do this?" (e.g., gun, knife, overdose, no plan, N/A)     No 4. RISK OF HARM - HOMICIDAL IDEATION:  "Do you ever have thoughts of hurting or killing someone else?"  (e.g., yes, no, no but preoccupation with thoughts about death)   - INTENT:  "Do you have thoughts of hurting or killing someone right NOW?" (e.g., yes, no, N/A)   - PLAN: "Do you have a specific plan for how you would do this?" (e.g., gun, knife, no plan, N/A)      No 5. FUNCTIONAL IMPAIRMENT: "How have things been going for you overall in your life? Have you had any more difficulties than usual doing your normal daily activities?"  (e.g., better, same, worse; self-care, school, work, interactions)     Sleeping too much 6. SUPPORT: "Who is with you now?" "Who do you live with?" "Do you  have family or friends nearby who you can talk to?"      Lives with her husband and children 7. THERAPIST: "Do you have a counselor or therapist? Name?"     No 8. STRESSORS: "Has there been any new stress or recent changes in your life?"     No 9. DRUG ABUSE/ALCOHOL: "Do you drink alcohol or use any illegal drugs?"      No 10. OTHER: "Do you have any other health or medical symptoms right now?" (e.g., fever)       Tired 11. PREGNANCY: "Is there any chance you are pregnant?" "When was your last menstrual period?"       No  Protocols used: DEPRESSION-A-AH

## 2018-09-02 ENCOUNTER — Encounter: Payer: Self-pay | Admitting: Nurse Practitioner

## 2018-09-02 ENCOUNTER — Ambulatory Visit (INDEPENDENT_AMBULATORY_CARE_PROVIDER_SITE_OTHER): Payer: 59 | Admitting: Nurse Practitioner

## 2018-09-02 VITALS — BP 104/70 | HR 105 | Temp 98.4°F | Resp 16 | Ht 69.0 in | Wt 180.9 lb

## 2018-09-02 DIAGNOSIS — F330 Major depressive disorder, recurrent, mild: Secondary | ICD-10-CM | POA: Diagnosis not present

## 2018-09-02 DIAGNOSIS — R45851 Suicidal ideations: Secondary | ICD-10-CM | POA: Diagnosis not present

## 2018-09-02 DIAGNOSIS — F411 Generalized anxiety disorder: Secondary | ICD-10-CM | POA: Diagnosis not present

## 2018-09-02 DIAGNOSIS — F410 Panic disorder [episodic paroxysmal anxiety] without agoraphobia: Secondary | ICD-10-CM

## 2018-09-02 MED ORDER — HYDROXYZINE PAMOATE 25 MG PO CAPS: 25.0000 mg | ORAL_CAPSULE | Freq: Three times a day (TID) | ORAL | 0 refills | Status: DC | PRN

## 2018-09-02 MED ORDER — DULOXETINE HCL 30 MG PO CPEP: 30.0000 mg | ORAL_CAPSULE | Freq: Every day | ORAL | 3 refills | Status: DC
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
## 2018-09-02 NOTE — Patient Instructions (Addendum)
-   Take cymbalta 90mg  (total) daily - Please go to [facility] walk-in crisis center tomorrow morning at 8:30am-  [address] [place], [place] - Employee counseling  - If you are having suidal thoughts;  Please immeditely contact a safe person: 1. Husband, Ashley Patel [phone] If you are unable to reach a safe person please call 911.   National Suicide Prevention Resources -Call [phone] (800-273-TALK) A free, 24-hour hotline available to anyone in suicidal crisis or emotional distress. - Same Day Access Hours:  Monday, Wednesday and Friday 8:30am - 3pm  Crisis Hours Mon-Fri 8am - 8pm; [address] [place], [place] - Call mobile crisis team [phone]

## 2018-09-03 ENCOUNTER — Telehealth: Payer: Self-pay | Admitting: Nurse Practitioner

## 2018-09-03 NOTE — Telephone Encounter (Signed)
Called patient  _____ States is overall feeling the same. No present SI. Was unable to go to [place] but plans to go on Friday. Also informed boss of situation and states she was very supportive and informed her they would work with her with whatever she needed and if she needed to reduce her hours or anything. Patient states she forgot that her kids would be out of school next week and knows that will not be a restful environment for her so she plans to go to work and just leave early to have some time to de-stress. Will follow-up on Monday with PCP.

## 2018-09-08 ENCOUNTER — Emergency Department
Admission: EM | Admit: 2018-09-08 | Discharge: 2018-09-09 | Disposition: A | Discharge status: Home / Self Care | Payer: 59 | Attending: Emergency Medicine | Admitting: Emergency Medicine

## 2018-09-08 ENCOUNTER — Ambulatory Visit (INDEPENDENT_AMBULATORY_CARE_PROVIDER_SITE_OTHER): Payer: 59 | Admitting: Family Medicine

## 2018-09-08 ENCOUNTER — Encounter: Payer: Self-pay | Admitting: Family Medicine

## 2018-09-08 ENCOUNTER — Other Ambulatory Visit: Payer: Self-pay

## 2018-09-08 VITALS — BP 102/70 | HR 124 | Temp 98.2°F | Resp 18 | Ht 69.0 in | Wt 179.0 lb

## 2018-09-08 DIAGNOSIS — Z87891 Personal history of nicotine dependence: Secondary | ICD-10-CM | POA: Insufficient documentation

## 2018-09-08 DIAGNOSIS — Z79899 Other long term (current) drug therapy: Secondary | ICD-10-CM | POA: Insufficient documentation

## 2018-09-08 DIAGNOSIS — F411 Generalized anxiety disorder: Secondary | ICD-10-CM

## 2018-09-08 DIAGNOSIS — J45909 Unspecified asthma, uncomplicated: Secondary | ICD-10-CM | POA: Diagnosis not present

## 2018-09-08 DIAGNOSIS — F332 Major depressive disorder, recurrent severe without psychotic features: Secondary | ICD-10-CM

## 2018-09-08 DIAGNOSIS — F329 Major depressive disorder, single episode, unspecified: Secondary | ICD-10-CM | POA: Diagnosis not present

## 2018-09-08 DIAGNOSIS — R45851 Suicidal ideations: Secondary | ICD-10-CM

## 2018-09-08 DIAGNOSIS — F32A Depression, unspecified: Secondary | ICD-10-CM

## 2018-09-08 LAB — URINE DRUG SCREEN, QUALITATIVE (ARMC ONLY)
AMPHETAMINES, UR SCREEN: NOT DETECTED
BENZODIAZEPINE, UR SCRN: NOT DETECTED
Barbiturates, Ur Screen: NOT DETECTED
COCAINE METABOLITE, UR ~~LOC~~: NOT DETECTED
Cannabinoid 50 Ng, Ur ~~LOC~~: POSITIVE — AB
MDMA (Ecstasy)Ur Screen: NOT DETECTED
METHADONE SCREEN, URINE: NOT DETECTED
OPIATE, UR SCREEN: NOT DETECTED
Phencyclidine (PCP) Ur S: NOT DETECTED
Tricyclic, Ur Screen: NOT DETECTED

## 2018-09-08 LAB — COMPREHENSIVE METABOLIC PANEL
ALBUMIN: 4.3 g/dL (ref 3.5–5.0)
ALT: 14 U/L (ref 0–44)
ANION GAP: 8 (ref 5–15)
AST: 19 U/L (ref 15–41)
Alkaline Phosphatase: 108 U/L (ref 38–126)
BUN: 14 mg/dL (ref 6–20)
CO2: 28 mmol/L (ref 22–32)
Calcium: 9.5 mg/dL (ref 8.9–10.3)
Chloride: 101 mmol/L (ref 98–111)
Creatinine, Ser: 0.82 mg/dL (ref 0.44–1.00)
GFR calc Af Amer: >60 mL/min (ref >60)
GFR calc non Af Amer: >60 mL/min (ref >60)
GLUCOSE: 173 mg/dL — AB (ref 70–99)
POTASSIUM: 3.6 mmol/L (ref 3.5–5.1)
SODIUM: 137 mmol/L (ref 135–145)
Total Bilirubin: 0.8 mg/dL (ref 0.3–1.2)
Total Protein: 7.8 g/dL (ref 6.5–8.1)

## 2018-09-08 LAB — CBC
HCT: 45.1 % (ref 35.0–47.0)
HEMOGLOBIN: 15 g/dL (ref 12.0–16.0)
MCH: 27.8 pg (ref 26.0–34.0)
MCHC: 33.2 g/dL (ref 32.0–36.0)
MCV: 83.9 fL (ref 80.0–100.0)
Platelets: 328 10*3/uL (ref 150–440)
RBC: 5.38 MIL/uL — AB (ref 3.80–5.20)
RDW: 13.9 % (ref 11.5–14.5)
WBC: 11.1 10*3/uL — AB (ref 3.6–11.0)

## 2018-09-08 LAB — ACETAMINOPHEN LEVEL

## 2018-09-08 LAB — SALICYLATE LEVEL: Salicylate Lvl: <7 mg/dL (ref 2.8–30.0)

## 2018-09-08 LAB — ETHANOL: Alcohol, Ethyl (B): <10 mg/dL (ref <10)
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
## 2018-09-08 NOTE — ED Notes (Signed)
Patient has been accepted to [facility].  Patient assigned to room [facility]. Accepting physician is Dr. Dareen Piano.  Call report to [phone].  Representative was [place].  Placement was facilitated by [place].   ER Staff is aware of it:   [place] ER Secretary  Dr. Kerman Passey, ER MD  Irine Seal Patient's Nurse

## 2018-09-08 NOTE — ED Notes (Addendum)
Patient upset that she has been placed at [facility].  She is requesting to stay at [facility] for treatment. She states that it is inconvenient for her husband to drive 1.5 hours to visit her at [facility]. She states, "If I'm suicidal again, I'm not going to [place] for help."

## 2018-09-08 NOTE — ED Triage Notes (Signed)
Pt IVC from [facility] - pt reports she is not having suicidal thoughts like [facility] reported - she states that she went to PCP for "help" this am and then went to [facility] today for "help" - pt reports that her thoughts are "dark" and that she needs help with medication because antidepressants are not working  - reports depression and suicidal ideation a few weeks ago

## 2018-09-08 NOTE — ED Notes (Signed)
Pt. Transferred to [facility] from ED to room 3 after screening for contraband. Report to include Situation, Background, Assessment and Recommendations from Kenisha RN. Pt. Oriented to unit including Q15 minute rounds as well as the security cameras for their protection. Patient is alert and oriented, warm and dry in no acute distress. Patient denies SI, HI, and AVH. Pt. Encouraged to let me know if needs arise. 

## 2018-09-08 NOTE — ED Provider Notes (Signed)
[facility] Emergency Department Provider Note  Time seen: 9:19 PM  I have reviewed the triage vital signs and the nursing notes.   HISTORY  Chief Complaint Suicidal    HPI Ashley Patel is a 37 y.o. female with a past medical history of anxiety, asthma, depression, presents to the emergency department under IVC for suicidal ideation/depression.  According to the patient she has been increasingly depressed recently, mention to her primary care doctor that over the past 3 to 4 days she has been having thoughts of hurting herself although denies any specific plan.  Patient's PCP sent her to [facility] for evaluation where she was placed under an IVC and sent to the emergency department.  Patient denies any alcohol use does state occasional marijuana usage.  States she has been depressed but denies any intent on killing herself.   Past Medical History:  Diagnosis Date  . Acne   . Anxiety   . Asthma   . Breast lump on right side at 12 o'clock position   . Depression   . Fatigue   . Hyperglycemia   . Insomnia   . Irritable bowel syndrome   . Numerous moles   . Vitamin B 12 deficiency   . Vitamin D deficiency     Patient Active Problem List   Diagnosis Date Noted  . Acid reflux 03/14/2018  . Panic attack 08/11/2015  . B12 deficiency 08/05/2015  . Acne 08/05/2015  . Blood glucose elevated 08/05/2015  . IBS (irritable bowel syndrome) 08/05/2015  . Numerous moles 08/05/2015  . Vitamin D deficiency 08/05/2015  . Family history of thyroid disease 08/05/2015  . Asthma without status asthmaticus 06/16/2010  . Anxiety and depression 06/16/2010    Past Surgical History:  Procedure Laterality Date  . CHOLECYSTECTOMY    . TONSILLECTOMY AND ADENOIDECTOMY      Prior to Admission medications   Medication Sig Start Date End Date Taking? Authorizing Provider  Cyanocobalamin (VITAMIN B-12) 1000 MCG SUBL Place 1 tablet under the tongue daily.    [provider]   DULoxetine (CYMBALTA) 30 MG capsule Take 1 capsule (30 mg total) by mouth daily. 09/02/18   Poulose, Bethel Born, NP  DULoxetine (CYMBALTA) 60 MG capsule Take 1 capsule (60 mg total) by mouth daily. 03/14/18   Poulose, Bethel Born, NP  hydrOXYzine (VISTARIL) 25 MG capsule Take 1 capsule (25 mg total) by mouth 3 (three) times daily as needed. 09/02/18   Poulose, Bethel Born, NP  Levonorgestrel-Ethinyl Estradiol (AMETHIA,CAMRESE) 0.15-0.03 &0.01 MG tablet Take 1 tablet by mouth daily. 02/19/17   Schermerhorn, Gwen Her, MD    Allergies  Allergen Reactions  . Amoxil [Amoxicillin]     Severe yeast     Family History  Problem Relation Age of Onset  . Hypertension Mother   . Other Father        post-polio syndrome  . Sudden Cardiac Death Neg Hx     Social History Social History   Tobacco Use  . Smoking status: Former Smoker    Packs/day: 0.50    Years: 5.00    Pack years: 2.50    Types: Cigarettes    Start date: 12/04/2003    Last attempt to quit: 11/04/2009    Years since quitting: 8.8  . Smokeless tobacco: Never Used  Substance Use Topics  . Alcohol use: Yes    Alcohol/week: 0.0 standard drinks    Comment: Drink 2x per year  . Drug use: No  Review of Systems Constitutional: Negative for fever. Cardiovascular: Negative for chest pain. Respiratory: Negative for shortness of breath. Gastrointestinal: Negative for abdominal pain, vomiting.  Intermittent diarrhea due to IBS. Genitourinary: Negative for urinary compaints Musculoskeletal: Negative for musculoskeletal complaints Skin: Negative for skin complaints  Neurological: Negative for headache All other ROS negative  ____________________________________________   PHYSICAL EXAM:  VITAL SIGNS: ED Triage Vitals  Enc Vitals Group     BP 09/08/18 2000 137/89     Pulse Rate 09/08/18 2000 (!) 113     Resp 09/08/18 2000 18     Temp 09/08/18 2000 98.4 F (36.9 C)     Temp Source 09/08/18 2000 Oral     SpO2 09/08/18 2000  100 %     Weight 09/08/18 2000 180 lb (81.6 kg)     Height 09/08/18 2000 5\' 9"  (1.753 m)     Head Circumference --      Peak Flow --      Pain Score 09/08/18 2008 2     Pain Loc --      Pain Edu? --      Excl. in Standard? --    Constitutional: Alert and oriented. Well appearing and in no distress. Eyes: Normal exam ENT   Head: Normocephalic and atraumatic.   Mouth/Throat: Mucous membranes are moist. Cardiovascular: Normal rate, regular rhythm. No murmur Respiratory: Normal respiratory effort without tachypnea nor retractions. Breath sounds are clear  Gastrointestinal: Soft and nontender. No distention.   Musculoskeletal: Nontender with normal range of motion in all extremities.  Neurologic:  Normal speech and language. No gross focal neurologic deficits Skin:  Skin is warm, dry and intact.  Psychiatric: Patient states depression with suicidal thoughts but denies intent to kill herself.  ____________________________________________   INITIAL IMPRESSION / ASSESSMENT AND PLAN / ED COURSE  Pertinent labs & imaging results that were available during my care of the patient were reviewed by me and considered in my medical decision making (see chart for details).  Patient presents to the emergency department with depression placed under IVC by [facility].  Here the patient admits to depression and has been having thoughts of hurting herself but denies any intent to kill herself.  Patient has been placed under IVC by [facility], we will have psychiatry reevaluate along with TTS to decide upon disposition/placement.  Patient's medical work-up thus far has been nonrevealing, patient has no active medical complaints.  ____________________________________________   FINAL CLINICAL IMPRESSION(S) / ED DIAGNOSES  Pression Suicidal ideation    Harvest Dark, MD 09/08/18 2121

## 2018-09-08 NOTE — ED Notes (Signed)
IVC pt, accepted at [facility], to be transported on 10.8.19

## 2018-09-09 DIAGNOSIS — K219 Gastro-esophageal reflux disease without esophagitis: Secondary | ICD-10-CM | POA: Diagnosis not present

## 2018-09-09 DIAGNOSIS — F411 Generalized anxiety disorder: Secondary | ICD-10-CM | POA: Diagnosis not present

## 2018-09-09 DIAGNOSIS — R45851 Suicidal ideations: Secondary | ICD-10-CM | POA: Diagnosis not present

## 2018-09-09 DIAGNOSIS — F332 Major depressive disorder, recurrent severe without psychotic features: Secondary | ICD-10-CM | POA: Diagnosis not present

## 2018-09-09 NOTE — ED Notes (Signed)
Hourly rounding reveals patient sleeping in room. No complaints, stable, in no acute distress. Q15 minute rounds and monitoring via Security Cameras to continue. 

## 2018-09-09 NOTE — ED Notes (Signed)
Patients husband is in the ER waiting room wanting to visit patient, explained to husband that she is still asleep and will soon be transported to [facility] in [place], gave husband the number to [facility], and let him know as soon as she wakes up will have her call him.

## 2018-09-09 NOTE — ED Notes (Signed)
Patient asked if she will be reassessed by a psychiatrist before she is transferred, paient did not feel it was fair that [facility] and staff could involuntarily commit her, and she also felt that [place] was to far to be away from husband. Patient stated she was not happy about this whole process, and how she was treated at [facility]

## 2018-09-09 NOTE — ED Notes (Signed)
Patient talking to husband

## 2018-09-09 NOTE — ED Notes (Signed)
Pt given lunch tray.

## 2018-09-09 NOTE — ED Notes (Signed)
Patient transferred to [facility] with [facility], patient and ASD received discharge papers and transfer papers. Patient received belongings and verbalized she has received all of her belongings. Patient appropriate and cooperative, Denies SI/HI AVH. Vital signs taken. NAD noted. Denies pain

## 2018-09-09 NOTE — ED Notes (Signed)
Pt given tooth brush and tooth paste

## 2018-09-10 ENCOUNTER — Telehealth: Payer: Self-pay

## 2018-09-10 NOTE — Telephone Encounter (Signed)
Patient called, no answer on home phone listed, cell phone called and unable to leave VM due to voice mailbox not set up.

## 2018-09-10 NOTE — Telephone Encounter (Signed)
Called and no voicemail was set up. Please ask how patient how she was feeling after being discharge from the hospital.

## 2018-09-12 DIAGNOSIS — F332 Major depressive disorder, recurrent severe without psychotic features: Secondary | ICD-10-CM | POA: Diagnosis not present

## 2018-09-25 DIAGNOSIS — F3181 Bipolar II disorder: Secondary | ICD-10-CM | POA: Diagnosis not present

## 2018-09-25 DIAGNOSIS — F4310 Post-traumatic stress disorder, unspecified: Secondary | ICD-10-CM | POA: Diagnosis not present

## 2018-09-25 DIAGNOSIS — F250 Schizoaffective disorder, bipolar type: Secondary | ICD-10-CM | POA: Diagnosis not present

## 2018-09-25 DIAGNOSIS — F411 Generalized anxiety disorder: Secondary | ICD-10-CM | POA: Diagnosis not present

## 2018-10-03 DIAGNOSIS — F411 Generalized anxiety disorder: Secondary | ICD-10-CM | POA: Diagnosis not present

## 2018-10-03 DIAGNOSIS — F4310 Post-traumatic stress disorder, unspecified: Secondary | ICD-10-CM | POA: Diagnosis not present

## 2018-10-03 DIAGNOSIS — F3181 Bipolar II disorder: Secondary | ICD-10-CM | POA: Diagnosis not present

## 2018-10-03 DIAGNOSIS — F250 Schizoaffective disorder, bipolar type: Secondary | ICD-10-CM | POA: Diagnosis not present

## 2018-10-09 ENCOUNTER — Telehealth: Payer: Self-pay | Admitting: Family Medicine

## 2018-10-09 NOTE — Telephone Encounter (Signed)
Error

## 2018-10-09 NOTE — Telephone Encounter (Signed)
Copied from [place] [phone]. Topic: Quick Communication - See Telephone Encounter >> Oct 09, 2018  3:12 PM Blase Mess A wrote: CRM for notification. See Telephone encounter for: 10/09/18. Patient is calling to see if Dr Ancil Boozer could right an order to have her lithium levels checked per her psychologist in [facility]? A previous order was written but hte patient lost it Please advise [phone]

## 2018-10-10 DIAGNOSIS — F3181 Bipolar II disorder: Secondary | ICD-10-CM | POA: Diagnosis not present

## 2018-10-10 DIAGNOSIS — F250 Schizoaffective disorder, bipolar type: Secondary | ICD-10-CM | POA: Diagnosis not present

## 2018-10-10 DIAGNOSIS — F4310 Post-traumatic stress disorder, unspecified: Secondary | ICD-10-CM | POA: Diagnosis not present

## 2018-10-10 DIAGNOSIS — F411 Generalized anxiety disorder: Secondary | ICD-10-CM | POA: Diagnosis not present

## 2018-10-10 NOTE — Telephone Encounter (Signed)
Please check on patient. I need the number of psychiatrist . They can send me a note and I can order labs if needed. Please facilitate for patient so she does not go without medication

## 2018-10-14 ENCOUNTER — Encounter: Payer: Self-pay | Admitting: Family Medicine

## 2018-10-14 ENCOUNTER — Ambulatory Visit: Payer: 59 | Admitting: Family Medicine

## 2018-10-14 VITALS — BP 104/70 | HR 83 | Temp 98.1°F | Resp 16 | Ht 69.0 in | Wt 183.9 lb

## 2018-10-14 DIAGNOSIS — J4521 Mild intermittent asthma with (acute) exacerbation: Secondary | ICD-10-CM

## 2018-10-14 DIAGNOSIS — J209 Acute bronchitis, unspecified: Secondary | ICD-10-CM | POA: Diagnosis not present

## 2018-10-14 DIAGNOSIS — F319 Bipolar disorder, unspecified: Secondary | ICD-10-CM | POA: Diagnosis not present

## 2018-10-14 DIAGNOSIS — Z79899 Other long term (current) drug therapy: Secondary | ICD-10-CM | POA: Diagnosis not present

## 2018-10-14 DIAGNOSIS — J45901 Unspecified asthma with (acute) exacerbation: Secondary | ICD-10-CM | POA: Diagnosis not present

## 2018-10-14 MED ORDER — BENZONATATE 100 MG PO CAPS: 100.0000 mg | ORAL_CAPSULE | Freq: Two times a day (BID) | ORAL | 0 refills | Status: DC | PRN

## 2018-10-14 MED ORDER — FLUTICASONE FUROATE-VILANTEROL 100-25 MCG/INH IN AEPB: 1.0000 | INHALATION_SPRAY | Freq: Every day | RESPIRATORY_TRACT | 0 refills | Status: DC

## 2018-10-14 NOTE — Patient Instructions (Signed)
Please ask NP psychiatrist to send me diagnosis code and what labs you need to have done and how often.  I also need her fax and name to send results  Please contact [facility] for a local therapist
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
## 2018-10-15 LAB — COMPLETE METABOLIC PANEL WITH GFR
AG Ratio: 1.6 (calc) (ref 1.0–2.5)
ALT: 17 U/L (ref 6–29)
AST: 16 U/L (ref 10–30)
Albumin: 4.2 g/dL (ref 3.6–5.1)
Alkaline phosphatase (APISO): 98 U/L (ref 33–115)
BUN: 13 mg/dL (ref 7–25)
CALCIUM: 9.4 mg/dL (ref 8.6–10.2)
CHLORIDE: 105 mmol/L (ref 98–110)
CO2: 26 mmol/L (ref 20–32)
Creat: 0.74 mg/dL (ref 0.50–1.10)
GFR, Est African American: 122 mL/min/{1.73_m2} (ref >=60)
GFR, Est Non African American: 105 mL/min/{1.73_m2} (ref >=60)
GLUCOSE: 82 mg/dL (ref 65–139)
Globulin: 2.6 g/dL (calc) (ref 1.9–3.7)
POTASSIUM: 3.9 mmol/L (ref 3.5–5.3)
Sodium: 141 mmol/L (ref 135–146)
Total Bilirubin: 0.4 mg/dL (ref 0.2–1.2)
Total Protein: 6.8 g/dL (ref 6.1–8.1)

## 2018-10-15 LAB — TSH: TSH: 7.64 mIU/L — ABNORMAL HIGH

## 2018-10-15 LAB — LITHIUM LEVEL: Lithium Lvl: 0.4 mmol/L — ABNORMAL LOW (ref 0.6–1.2)

## 2018-10-23 MED ORDER — AZITHROMYCIN 250 MG PO TABS: ORAL_TABLET | ORAL | 0 refills | Status: DC

## 2018-10-23 NOTE — Telephone Encounter (Signed)
Copied from [place] [id number]. Topic: General - Other >> Oct 23, 2018 10:37 AM Keene Breath wrote: Reason for CRM: Patient called to request an antibiotic for her bronchitis.  Patient stated that the doctor told her that if her condition got worse, she should call the office to get the antibiotic called into the pharmacy.  Preferred pharmacy is:  [facility], [place] [phone] (Phone) [phone] (Fax)  Please call patient back if there are any questions.  CB# [phone]

## 2018-10-23 NOTE — Telephone Encounter (Signed)
Sent zpack to [facility]

## 2018-10-24 DIAGNOSIS — F3181 Bipolar II disorder: Secondary | ICD-10-CM | POA: Diagnosis not present

## 2018-10-24 DIAGNOSIS — F4310 Post-traumatic stress disorder, unspecified: Secondary | ICD-10-CM | POA: Diagnosis not present

## 2018-10-24 DIAGNOSIS — F250 Schizoaffective disorder, bipolar type: Secondary | ICD-10-CM | POA: Diagnosis not present

## 2018-10-24 DIAGNOSIS — F411 Generalized anxiety disorder: Secondary | ICD-10-CM | POA: Diagnosis not present

## 2018-10-24 NOTE — Telephone Encounter (Signed)
Called to inform patient Dr. Ancil Boozer sent in a Z pack to [facility]. Patient was unavailable and no voicemail set up.

## 2018-11-11 ENCOUNTER — Encounter: Payer: Self-pay | Admitting: Family Medicine

## 2018-11-12 ENCOUNTER — Ambulatory Visit (INDEPENDENT_AMBULATORY_CARE_PROVIDER_SITE_OTHER): Payer: 59 | Admitting: Nurse Practitioner

## 2018-11-12 ENCOUNTER — Encounter: Payer: Self-pay | Admitting: Nurse Practitioner

## 2018-11-12 VITALS — BP 100/66 | HR 88 | Temp 98.0°F | Resp 16 | Ht 69.0 in | Wt 189.5 lb

## 2018-11-12 DIAGNOSIS — R300 Dysuria: Secondary | ICD-10-CM | POA: Diagnosis not present

## 2018-11-12 DIAGNOSIS — R350 Frequency of micturition: Secondary | ICD-10-CM | POA: Diagnosis not present

## 2018-11-12 MED ORDER — NITROFURANTOIN MONOHYD MACRO 100 MG PO CAPS: 100.0000 mg | ORAL_CAPSULE | Freq: Two times a day (BID) | ORAL | 0 refills | Status: DC

## 2018-11-12 NOTE — Progress Notes (Signed)
Name: Ashley Patel   MRN: [id number]    DOB: Ashley Patel 03, 1982   Date:11/12/2018       Progress Note  Subjective  Chief Complaint  Chief Complaint  Patient presents with  . Urinary Tract Infection    patient stated that her sx started last night. AZO    HPI  Patient endorses dysuria and urinary frequency, urgency ongoing since yesterday. Patient has been taking OTC AZO with some relief. Drinks at least 6-8 cups a day. Drinks a lot of caffeine- 2 sodas a day. No vaginal discharge, itching, flank pain, nausea, vomiting, fevers or chills.   Patient Active Problem List   Diagnosis Date Noted  . Acid reflux 03/14/2018  . Panic attack 08/11/2015  . B12 deficiency 08/05/2015  . Acne 08/05/2015  . Blood glucose elevated 08/05/2015  . IBS (irritable bowel syndrome) 08/05/2015  . Numerous moles 08/05/2015  . Vitamin D deficiency 08/05/2015  . Family history of thyroid disease 08/05/2015  . Asthma without status asthmaticus 06/16/2010  . Anxiety and depression 06/16/2010    Past Medical History:  Diagnosis Date  . Acne   . Anxiety   . Asthma   . Breast lump on right side at 12 o'clock position   . Depression   . Fatigue   . Hyperglycemia   . Insomnia   . Irritable bowel syndrome   . Numerous moles   . Vitamin B 12 deficiency   . Vitamin D deficiency     Past Surgical History:  Procedure Laterality Date  . CHOLECYSTECTOMY    . TONSILLECTOMY AND ADENOIDECTOMY      Social History   Tobacco Use  . Smoking status: Former Smoker    Packs/day: 0.50    Years: 5.00    Pack years: 2.50    Types: Cigarettes    Start date: 12/04/2003    Last attempt to quit: 11/04/2009    Years since quitting: 9.0  . Smokeless tobacco: Never Used  Substance Use Topics  . Alcohol use: Yes    Alcohol/week: 0.0 standard drinks    Comment: Drink 2x per year     Current Outpatient Medications:  .  busPIRone (BUSPAR) 10 MG tablet, , Disp: , Rfl: 1 .  Cyanocobalamin (VITAMIN B-12) 1000 MCG SUBL,  Place 1 tablet under the tongue daily., Disp: , Rfl:  .  DULoxetine (CYMBALTA) 60 MG capsule, Take 1 capsule (60 mg total) by mouth daily., Disp: 90 capsule, Rfl: 1 .  fluticasone furoate-vilanterol (BREO ELLIPTA) 100-25 MCG/INH AEPB, Inhale 1 puff into the lungs daily., Disp: 60 each, Rfl: 0 .  lithium carbonate 300 MG capsule, Take 1 capsule by mouth 2 (two) times daily., Disp: , Rfl: 0 .  REXULTI 1 MG TABS, , Disp: , Rfl: 2  Allergies  Allergen Reactions  . Amoxil [Amoxicillin]     Severe yeast     ROS  No other specific complaints in a complete review of systems (except as listed in HPI above).  Objective  Vitals:   11/12/18 1456  BP: 100/66  Pulse: 88  Resp: 16  Temp: 98 F (36.7 C)  TempSrc: Oral  SpO2: 99%  Weight: 189 lb 8 oz (86 kg)  Height: 5\' 9"  (1.753 m)     Body mass index is 27.98 kg/m.  Nursing Note and Vital Signs reviewed.  Physical Exam  Constitutional: She is oriented to person, place, and time. She appears well-developed and well-nourished. She is cooperative.  HENT:  Head: Normocephalic and atraumatic.  Right Ear: Hearing normal.  Left Ear: Hearing normal.  Mouth/Throat: Mucous membranes are normal.  Eyes: Conjunctivae are normal.  Cardiovascular: Normal rate, regular rhythm and normal heart sounds.  Pulmonary/Chest: Effort normal and breath sounds normal.  Abdominal: Soft. Normal appearance and bowel sounds are normal. There is no tenderness (No CVA tenderness).  Musculoskeletal: Normal range of motion.  Neurological: She is alert and oriented to person, place, and time.  Psychiatric: She has a normal mood and affect. Her speech is normal and behavior is normal. Judgment and thought content normal.     No results found for this or any previous visit (from the past 48 hour(s)).  Assessment & Plan  1. Dysuria - Urinalysis, Routine w reflex microscopic - Urine Culture - nitrofurantoin, macrocrystal-monohydrate, (MACROBID) 100 MG capsule;  Take 1 capsule (100 mg total) by mouth 2 (two) times daily.  Dispense: 10 capsule; Refill: 0  2. Urinary frequency - Urinalysis, Routine w reflex microscopic - Urine Culture - nitrofurantoin, macrocrystal-monohydrate, (MACROBID) 100 MG capsule; Take 1 capsule (100 mg total) by mouth 2 (two) times daily.  Dispense: 10 capsule; Refill: 0

## 2018-11-12 NOTE — Patient Instructions (Signed)
Your symptoms should begin to improve within a day of starting antibiotics. But you should finish all the antibiotic pills you get to ensure the bacteria is killed and prevent antibiotic resistance.  If you are having pain when you pee, you can also take a medicine to numb your bladder. Look for Phenazopyridine (Pyridium or AZO) over the counter at your pharmacy. This medicine eases the pain caused by urinary tract infections. It also reduces the need to urinate.  If you frequently get UTI's here are some prevention tips:  - Avoiding spermicides  - Drinking more fluid - This can help prevent bladder infections. ?Urinating right after sex - Some doctors think this helps, because it helps flush out germs that might get into the bladder during sex. There is no proof it works, but it also cannot hurt. ?Vaginal estrogen - If you are a woman who has already been through menopause, your doctor might suggest this. Vaginal estrogen comes in a cream or a flexible ring that you put into your vagina. It can help prevent bladder infections. ?Antibiotics - If you get a lot of bladder infections, and the above methods have not helped, your doctor might give you antibiotics to help prevent infection. But taking antibiotics has downsides, so doctors usually suggest trying other things first   The studies suggesting that cranberry products prevent bladder infections are not very good. Other studies suggest that cranberry products do not prevent bladder infections. But if you want to try cranberry products for this purpose, there is probably not much harm in doing so.   

## 2018-11-14 LAB — URINALYSIS, ROUTINE W REFLEX MICROSCOPIC
Bilirubin Urine: NEGATIVE
Glucose, UA: NEGATIVE
Hyaline Cast: NONE SEEN /LPF
KETONES UR: NEGATIVE
NITRITE: POSITIVE — AB
Specific Gravity, Urine: 1.022 (ref 1.001–1.03)
Squamous Epithelial / HPF: NONE SEEN /HPF (ref <=5)
pH: 6.5 (ref 5.0–8.0)

## 2018-11-14 LAB — URINE CULTURE
MICRO NUMBER:: 91485558
SPECIMEN QUALITY:: ADEQUATE

## 2018-11-24 DIAGNOSIS — F4310 Post-traumatic stress disorder, unspecified: Secondary | ICD-10-CM | POA: Diagnosis not present

## 2018-11-24 DIAGNOSIS — F3181 Bipolar II disorder: Secondary | ICD-10-CM | POA: Diagnosis not present

## 2018-11-24 DIAGNOSIS — F411 Generalized anxiety disorder: Secondary | ICD-10-CM | POA: Diagnosis not present

## 2018-11-24 DIAGNOSIS — F250 Schizoaffective disorder, bipolar type: Secondary | ICD-10-CM | POA: Diagnosis not present

## 2018-12-11 DIAGNOSIS — F3181 Bipolar II disorder: Secondary | ICD-10-CM | POA: Diagnosis not present

## 2018-12-11 DIAGNOSIS — F4310 Post-traumatic stress disorder, unspecified: Secondary | ICD-10-CM | POA: Diagnosis not present

## 2018-12-11 DIAGNOSIS — F250 Schizoaffective disorder, bipolar type: Secondary | ICD-10-CM | POA: Diagnosis not present

## 2018-12-11 DIAGNOSIS — F411 Generalized anxiety disorder: Secondary | ICD-10-CM | POA: Diagnosis not present

## 2018-12-25 DIAGNOSIS — F4310 Post-traumatic stress disorder, unspecified: Secondary | ICD-10-CM | POA: Diagnosis not present

## 2018-12-25 DIAGNOSIS — F250 Schizoaffective disorder, bipolar type: Secondary | ICD-10-CM | POA: Diagnosis not present

## 2018-12-25 DIAGNOSIS — F3181 Bipolar II disorder: Secondary | ICD-10-CM | POA: Diagnosis not present

## 2018-12-25 DIAGNOSIS — F411 Generalized anxiety disorder: Secondary | ICD-10-CM | POA: Diagnosis not present

## 2019-02-23 DIAGNOSIS — F411 Generalized anxiety disorder: Secondary | ICD-10-CM | POA: Diagnosis not present

## 2019-02-23 DIAGNOSIS — F3181 Bipolar II disorder: Secondary | ICD-10-CM | POA: Diagnosis not present

## 2019-02-23 DIAGNOSIS — F250 Schizoaffective disorder, bipolar type: Secondary | ICD-10-CM | POA: Diagnosis not present

## 2019-02-23 DIAGNOSIS — F4310 Post-traumatic stress disorder, unspecified: Secondary | ICD-10-CM | POA: Diagnosis not present

## 2019-05-14 DIAGNOSIS — F4310 Post-traumatic stress disorder, unspecified: Secondary | ICD-10-CM | POA: Diagnosis not present

## 2019-05-14 DIAGNOSIS — F250 Schizoaffective disorder, bipolar type: Secondary | ICD-10-CM | POA: Diagnosis not present

## 2019-05-14 DIAGNOSIS — F3181 Bipolar II disorder: Secondary | ICD-10-CM | POA: Diagnosis not present

## 2019-05-14 DIAGNOSIS — F411 Generalized anxiety disorder: Secondary | ICD-10-CM | POA: Diagnosis not present

## 2020-03-25 ENCOUNTER — Other Ambulatory Visit: Payer: Self-pay | Admitting: Family Medicine

## 2020-03-25 ENCOUNTER — Encounter: Payer: Self-pay | Admitting: Family Medicine

## 2020-03-25 ENCOUNTER — Other Ambulatory Visit: Payer: Self-pay

## 2020-03-25 ENCOUNTER — Ambulatory Visit (INDEPENDENT_AMBULATORY_CARE_PROVIDER_SITE_OTHER): Payer: BC Managed Care – PPO | Admitting: Family Medicine

## 2020-03-25 ENCOUNTER — Other Ambulatory Visit (HOSPITAL_COMMUNITY)
Admission: RE | Admit: 2020-03-25 | Discharge: 2020-03-25 | Disposition: A | Discharge status: Home / Self Care | Payer: BC Managed Care – PPO | Source: Ambulatory Visit | Attending: Family Medicine | Admitting: Family Medicine

## 2020-03-25 VITALS — BP 122/76 | HR 79 | Temp 97.9°F | Resp 16 | Ht 69.0 in | Wt 176.8 lb

## 2020-03-25 DIAGNOSIS — Z124 Encounter for screening for malignant neoplasm of cervix: Secondary | ICD-10-CM | POA: Diagnosis not present

## 2020-03-25 DIAGNOSIS — E538 Deficiency of other specified B group vitamins: Secondary | ICD-10-CM

## 2020-03-25 DIAGNOSIS — F419 Anxiety disorder, unspecified: Secondary | ICD-10-CM

## 2020-03-25 DIAGNOSIS — Z1159 Encounter for screening for other viral diseases: Secondary | ICD-10-CM

## 2020-03-25 DIAGNOSIS — F3281 Premenstrual dysphoric disorder: Secondary | ICD-10-CM

## 2020-03-25 DIAGNOSIS — R7989 Other specified abnormal findings of blood chemistry: Secondary | ICD-10-CM

## 2020-03-25 DIAGNOSIS — F319 Bipolar disorder, unspecified: Secondary | ICD-10-CM | POA: Diagnosis not present

## 2020-03-25 DIAGNOSIS — Z13220 Encounter for screening for lipoid disorders: Secondary | ICD-10-CM | POA: Diagnosis not present

## 2020-03-25 DIAGNOSIS — E559 Vitamin D deficiency, unspecified: Secondary | ICD-10-CM | POA: Diagnosis not present

## 2020-03-25 DIAGNOSIS — Z131 Encounter for screening for diabetes mellitus: Secondary | ICD-10-CM

## 2020-03-25 DIAGNOSIS — J4520 Mild intermittent asthma, uncomplicated: Secondary | ICD-10-CM

## 2020-03-25 DIAGNOSIS — Z0000 Encounter for general adult medical examination without abnormal findings: Secondary | ICD-10-CM | POA: Diagnosis not present

## 2020-03-25 DIAGNOSIS — Z3009 Encounter for other general counseling and advice on contraception: Secondary | ICD-10-CM

## 2020-03-25 DIAGNOSIS — Z130 Encounter for screening for diseases of the blood and blood-forming organs and certain disorders involving the immune mechanism: Secondary | ICD-10-CM

## 2020-03-25 MED ORDER — LEVONORGEST-ETH ESTRAD 91-DAY 0.15-0.03 &0.01 MG PO TABS: 1.0000 | ORAL_TABLET | Freq: Every day | ORAL | 4 refills | Status: DC

## 2020-03-25 MED ORDER — BUSPIRONE HCL 10 MG PO TABS: 10.0000 mg | ORAL_TABLET | Freq: Two times a day (BID) | ORAL | 2 refills | Status: DC

## 2020-03-25 NOTE — Patient Instructions (Addendum)
Start contraception day 2 of your next cycle  Preventive Care 61-37 Years Old, Female Preventive care refers to visits with your health care provider and lifestyle choices that can promote health and wellness. This includes:  A yearly physical exam. This may also be called an annual well check.  Regular dental visits and eye exams.  Immunizations.  Screening for certain conditions.  Healthy lifestyle choices, such as eating a healthy diet, getting regular exercise, not using drugs or products that contain nicotine and tobacco, and limiting alcohol use. What can I expect for my preventive care visit? Physical exam Your health care provider will check your:  Height and weight. This may be used to calculate body mass index (BMI), which tells if you are at a healthy weight.  Heart rate and blood pressure.  Skin for abnormal spots. Counseling Your health care provider may ask you questions about your:  Alcohol, tobacco, and drug use.  Emotional well-being.  Home and relationship well-being.  Sexual activity.  Eating habits.  Work and work Statistician.  Method of birth control.  Menstrual cycle.  Pregnancy history. What immunizations do I need?  Influenza (flu) vaccine  This is recommended every year. Tetanus, diphtheria, and pertussis (Tdap) vaccine  You may need a Td booster every 10 years. Varicella (chickenpox) vaccine  You may need this if you have not been vaccinated. Human papillomavirus (HPV) vaccine  If recommended by your health care provider, you may need three doses over 6 months. Measles, mumps, and rubella (MMR) vaccine  You may need at least one dose of MMR. You may also need a second dose. Meningococcal conjugate (MenACWY) vaccine  One dose is recommended if you are age 34-21 years and a first-year college student living in a residence hall, or if you have one of several medical conditions. You may also need additional booster  doses. Pneumococcal conjugate (PCV13) vaccine  You may need this if you have certain conditions and were not previously vaccinated. Pneumococcal polysaccharide (PPSV23) vaccine  You may need one or two doses if you smoke cigarettes or if you have certain conditions. Hepatitis A vaccine  You may need this if you have certain conditions or if you travel or work in places where you may be exposed to hepatitis A. Hepatitis B vaccine  You may need this if you have certain conditions or if you travel or work in places where you may be exposed to hepatitis B. Haemophilus influenzae type b (Hib) vaccine  You may need this if you have certain conditions. You may receive vaccines as individual doses or as more than one vaccine together in one shot (combination vaccines). Talk with your health care provider about the risks and benefits of combination vaccines. What tests do I need?  Blood tests  Lipid and cholesterol levels. These may be checked every 5 years starting at age 72.  Hepatitis C test.  Hepatitis B test. Screening  Diabetes screening. This is done by checking your blood sugar (glucose) after you have not eaten for a while (fasting).  Sexually transmitted disease (STD) testing.  BRCA-related cancer screening. This may be done if you have a family history of breast, ovarian, tubal, or peritoneal cancers.  Pelvic exam and Pap test. This may be done every 3 years starting at age 67. Starting at age 34, this may be done every 5 years if you have a Pap test in combination with an HPV test. Talk with your health care provider about your test results, treatment  options, and if necessary, the need for more tests. Follow these instructions at home: Eating and drinking   Eat a diet that includes fresh fruits and vegetables, whole grains, lean protein, and low-fat dairy.  Take vitamin and mineral supplements as recommended by your health care provider.  Do not drink alcohol  if: ? Your health care provider tells you not to drink. ? You are pregnant, may be pregnant, or are planning to become pregnant.  If you drink alcohol: ? Limit how much you have to 0-1 drink a day. ? Be aware of how much alcohol is in your drink. In the [place] one drink equals one 12 oz bottle of beer (355 mL), one 5 oz glass of wine (148 mL), or one 1 oz glass of hard liquor (44 mL). Lifestyle  Take daily care of your teeth and gums.  Stay active. Exercise for at least 30 minutes on 5 or more days each week.  Do not use any products that contain nicotine or tobacco, such as cigarettes, e-cigarettes, and chewing tobacco. If you need help quitting, ask your health care provider.  If you are sexually active, practice safe sex. Use a condom or other form of birth control (contraception) in order to prevent pregnancy and STIs (sexually transmitted infections). If you plan to become pregnant, see your health care provider for a preconception visit. What's next?  Visit your health care provider once a year for a well check visit.  Ask your health care provider how often you should have your eyes and teeth checked.  Stay up to date on all vaccines. This information is not intended to replace advice given to you by your health care provider. Make sure you discuss any questions you have with your health care provider. Document Revised: 07/31/2018 Document Reviewed: 07/31/2018 Elsevier Patient Education  2020 [employer]  
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
## 2020-03-28 LAB — COMPLETE METABOLIC PANEL WITH GFR
AG Ratio: 1.8 (calc) (ref 1.0–2.5)
ALT: 11 U/L (ref 6–29)
AST: 12 U/L (ref 10–30)
Albumin: 4.3 g/dL (ref 3.6–5.1)
Alkaline phosphatase (APISO): 82 U/L (ref 31–125)
BUN: 15 mg/dL (ref 7–25)
CO2: 28 mmol/L (ref 20–32)
Calcium: 9.3 mg/dL (ref 8.6–10.2)
Chloride: 105 mmol/L (ref 98–110)
Creat: 0.7 mg/dL (ref 0.50–1.10)
GFR, Est African American: 129 mL/min/{1.73_m2} (ref >=60)
GFR, Est Non African American: 111 mL/min/{1.73_m2} (ref >=60)
Globulin: 2.4 g/dL (calc) (ref 1.9–3.7)
Glucose, Bld: 88 mg/dL (ref 65–99)
Potassium: 4.1 mmol/L (ref 3.5–5.3)
Sodium: 141 mmol/L (ref 135–146)
Total Bilirubin: 0.5 mg/dL (ref 0.2–1.2)
Total Protein: 6.7 g/dL (ref 6.1–8.1)

## 2020-03-28 LAB — HEPATITIS C ANTIBODY
Hepatitis C Ab: NONREACTIVE
SIGNAL TO CUT-OFF: 0.01 (ref <1.00)

## 2020-03-28 LAB — CBC WITH DIFFERENTIAL/PLATELET
Absolute Monocytes: 570 cells/uL (ref 200–950)
Basophils Absolute: 37 cells/uL (ref 0–200)
Basophils Relative: 0.6 %
Eosinophils Absolute: 192 cells/uL (ref 15–500)
Eosinophils Relative: 3.1 %
HCT: 44 % (ref 35.0–45.0)
Hemoglobin: 14.5 g/dL (ref 11.7–15.5)
Lymphs Abs: 2331 cells/uL (ref 850–3900)
MCH: 28.8 pg (ref 27.0–33.0)
MCHC: 33 g/dL (ref 32.0–36.0)
MCV: 87.5 fL (ref 80.0–100.0)
MPV: 10.2 fL (ref 7.5–12.5)
Monocytes Relative: 9.2 %
Neutro Abs: 3069 cells/uL (ref 1500–7800)
Neutrophils Relative %: 49.5 %
Platelets: 294 10*3/uL (ref 140–400)
RBC: 5.03 10*6/uL (ref 3.80–5.10)
RDW: 12 % (ref 11.0–15.0)
Total Lymphocyte: 37.6 %
WBC: 6.2 10*3/uL (ref 3.8–10.8)

## 2020-03-28 LAB — HEMOGLOBIN A1C
Hgb A1c MFr Bld: 5 % of total Hgb (ref <5.7)
Mean Plasma Glucose: 97 (calc)
eAG (mmol/L): 5.4 (calc)

## 2020-03-28 LAB — LIPID PANEL
Cholesterol: 162 mg/dL (ref <200)
HDL: 56 mg/dL (ref >=50)
LDL Cholesterol (Calc): 93 mg/dL (calc)
Non-HDL Cholesterol (Calc): 106 mg/dL (calc) (ref <130)
Total CHOL/HDL Ratio: 2.9 (calc) (ref <5.0)
Triglycerides: 52 mg/dL (ref <150)

## 2020-03-28 LAB — TSH: TSH: 2.64 mIU/L

## 2020-03-28 LAB — VITAMIN D 25 HYDROXY (VIT D DEFICIENCY, FRACTURES): Vit D, 25-Hydroxy: 17 ng/mL — ABNORMAL LOW (ref 30–100)

## 2020-03-28 LAB — VITAMIN B12: Vitamin B-12: 333 pg/mL (ref 200–1100)

## 2020-03-29 LAB — CYTOLOGY - PAP
Comment: NEGATIVE
Diagnosis: UNDETERMINED — AB
High risk HPV: NEGATIVE

## 2020-07-25 DIAGNOSIS — Z20822 Contact with and (suspected) exposure to covid-19: Secondary | ICD-10-CM | POA: Diagnosis not present

## 2020-09-26 ENCOUNTER — Encounter: Payer: Self-pay | Admitting: Family Medicine

## 2020-09-26 ENCOUNTER — Ambulatory Visit (INDEPENDENT_AMBULATORY_CARE_PROVIDER_SITE_OTHER): Payer: BC Managed Care – PPO | Admitting: Family Medicine

## 2020-09-26 ENCOUNTER — Other Ambulatory Visit: Payer: Self-pay

## 2020-09-26 ENCOUNTER — Other Ambulatory Visit: Payer: Self-pay | Admitting: Family Medicine

## 2020-09-26 VITALS — BP 130/78 | HR 91 | Temp 98.5°F | Resp 16 | Ht 69.0 in | Wt 169.9 lb

## 2020-09-26 DIAGNOSIS — F5105 Insomnia due to other mental disorder: Secondary | ICD-10-CM

## 2020-09-26 DIAGNOSIS — E538 Deficiency of other specified B group vitamins: Secondary | ICD-10-CM

## 2020-09-26 DIAGNOSIS — F3281 Premenstrual dysphoric disorder: Secondary | ICD-10-CM

## 2020-09-26 DIAGNOSIS — Z23 Encounter for immunization: Secondary | ICD-10-CM

## 2020-09-26 DIAGNOSIS — F419 Anxiety disorder, unspecified: Secondary | ICD-10-CM

## 2020-09-26 DIAGNOSIS — J4520 Mild intermittent asthma, uncomplicated: Secondary | ICD-10-CM

## 2020-09-26 DIAGNOSIS — F39 Unspecified mood [affective] disorder: Secondary | ICD-10-CM | POA: Diagnosis not present

## 2020-09-26 DIAGNOSIS — F99 Mental disorder, not otherwise specified: Secondary | ICD-10-CM

## 2020-09-26 DIAGNOSIS — E559 Vitamin D deficiency, unspecified: Secondary | ICD-10-CM

## 2020-09-26 MED ORDER — BUSPIRONE HCL 10 MG PO TABS: 10.0000 mg | ORAL_TABLET | Freq: Two times a day (BID) | ORAL | 1 refills | Status: DC

## 2020-09-26 MED ORDER — TRAZODONE HCL 50 MG PO TABS: 25.0000 mg | ORAL_TABLET | Freq: Every evening | ORAL | 0 refills | Status: DC | PRN

## 2020-09-26 MED ORDER — ALBUTEROL SULFATE HFA 108 (90 BASE) MCG/ACT IN AERS: 2.0000 | INHALATION_SPRAY | Freq: Four times a day (QID) | RESPIRATORY_TRACT | 0 refills | Status: DC | PRN

## 2020-09-26 NOTE — Progress Notes (Signed)
Name: Ashley Patel   MRN: [id number]    DOB: 21-Aug-1981   Date:09/26/2020       Progress Note  Subjective  Chief Complaint  Chief Complaint  Patient presents with  . Follow-up    6 month follow up  . Medication Management    Birth Control    HPI  Premenstrual Dysphoria/Mood disorder  : she was admitted with suicidal thoughts back in 2019 and diagnosed with bipolar disorder, she took lithium and felt like it did not help with symptoms. She quit her job January 2020 and mood improved quickly after she quit it. She states that before she quit her job she had some auditory hallucinations and one episode of visual hallucinations but all symptoms resolved since. She states shifts abruptly through her cycles. She develops  irritability , feels very angry and has episodes of rage episodes that is present two weeks prior to cycles but much worse a few days before and she needs to walk away and go for a ride to avoid blowing up in front of the kids. She states two days prior to her cycles she has suicidal thoughts but she knows that it resolves once her cycles re-starts  . She has used Mirena in the past but she did not like it because she felt off and used to have UTI's. We gave seasonique back in March 2021, she is compliant with medication but still has, she states medication helped her through the first 3 months. She states this time she has been bleeding for the past 4 weeks. She has not been able to sleep. She states she does not think she has bipolar and wants to remove that from her cycle. She wants to see someone to manage her hormone imbalance. Discussed that the diagnosis of bipolar should not bother her as long as she can feel better, but GYN is the sub-specialist to help her with her pre-menstrual dysphoria. I also recommended her to see another psychiatrist. She has been falling asleep, she wakes up after 3-4 hours and is unable to sleep during the day. Explained she may be in a manic cycle.     B12 deficiency/Vitamin D deficiency:  She is taking supplementation   Asthma Mild intermittent: she denies wheezing, cough or sob, not currently on medication, discussed importance of keeping albuterol at home   Patient Active Problem List   Diagnosis Date Noted  . Premenstrual dysphoria 03/25/2020  . Bipolar disorder ([place]) 03/25/2020  . Acid reflux 03/14/2018  . Panic attack 08/11/2015  . B12 deficiency 08/05/2015  . Acne 08/05/2015  . Blood glucose elevated 08/05/2015  . IBS (irritable bowel syndrome) 08/05/2015  . Numerous moles 08/05/2015  . Vitamin D deficiency 08/05/2015  . Family history of thyroid disease 08/05/2015  . Asthma without status asthmaticus 06/16/2010    Past Surgical History:  Procedure Laterality Date  . CHOLECYSTECTOMY    . TONSILLECTOMY AND ADENOIDECTOMY      Family History  Problem Relation Age of Onset  . Hypertension Mother   . Other Father        post-polio syndrome  . Sudden Cardiac Death Neg Hx     Social History   Tobacco Use  . Smoking status: Former Smoker    Packs/day: 0.50    Years: 5.00    Pack years: 2.50    Types: Cigarettes    Start date: 12/04/2003    Quit date: 11/04/2009    Years since quitting: 10.9  . Smokeless tobacco:  Never Used  Substance Use Topics  . Alcohol use: Yes    Alcohol/week: 0.0 standard drinks    Comment: Drink 2x per year     Current Outpatient Medications:  .  busPIRone (BUSPAR) 10 MG tablet, Take 1 tablet (10 mg total) by mouth 2 (two) times daily., Disp: 60 tablet, Rfl: 2 .  Cyanocobalamin (VITAMIN B-12) 1000 MCG SUBL, Place 1 tablet under the tongue daily., Disp: , Rfl:  .  Levonorgestrel-Ethinyl Estradiol (SEASONIQUE) 0.15-0.03 &0.01 MG tablet, Take 1 tablet by mouth daily., Disp: 1 Package, Rfl: 4  Allergies  Allergen Reactions  . Amoxil [Amoxicillin]     Severe yeast     I personally reviewed active problem list, medication list, allergies, family history, social history, health  maintenance with the patient/caregiver today.   ROS  Constitutional: Negative for fever or weight change.  Respiratory: Negative for cough and shortness of breath.   Cardiovascular: Negative for chest pain or palpitations.  Gastrointestinal: Negative for abdominal pain, no bowel changes.  Musculoskeletal: Negative for gait problem or joint swelling.  Skin: Negative for rash.  Neurological: Negative for dizziness or headache.  No other specific complaints in a complete review of systems (except as listed in HPI above).  Objective  Vitals:   09/26/20 0832  BP: 130/78  Pulse: 91  Resp: 16  Temp: 98.5 F (36.9 C)  SpO2: 99%  Weight: 169 lb 14.4 oz (77.1 kg)  Height: 5\' 9"  (1.753 m)    Body mass index is 25.09 kg/m.  Physical Exam  Constitutional: Patient appears well-developed and well-nourished.  No distress.  HEENT: head atraumatic, normocephalic, pupils equal and reactive to light, neck supple Cardiovascular: Normal rate, regular rhythm and normal heart sounds.  No murmur heard. No BLE edema. Pulmonary/Chest: Effort normal and breath sounds normal. No respiratory distress. Abdominal: Soft.  There is no tenderness. Psychiatric: Patient has a normal mood and affect. behavior is normal. Judgment and thought content normal. Talking more than usual, but no pressured speech   PHQ2/9: Depression screen [facility] 2/9 09/26/2020 03/25/2020 11/12/2018 10/14/2018 09/08/2018  Decreased Interest 3 0 1 0 3  Down, Depressed, Hopeless 3 2 3 2 3   PHQ - 2 Score 6 2 4 2 6   Altered sleeping 3 2 0 2 3  Tired, decreased energy 3 2 0 0 3  Change in appetite 0 2 3 0 3  Feeling bad or failure about yourself  0 0 0 0 3  Trouble concentrating 3 3 3  0 2  Moving slowly or fidgety/restless 0 0 0 0 0  Suicidal thoughts 0 0 0 0 2  PHQ-9 Score 15 11 10 4 22   Difficult doing work/chores Extremely dIfficult Somewhat difficult - Not difficult at all Extremely dIfficult  Some recent data might be hidden     phq 9 is positive   Fall Risk: Fall Risk  09/26/2020 03/25/2020 11/12/2018 10/14/2018 09/08/2018  Falls in the past year? 0 0 0 0 No  Number falls in past yr: 0 0 0 - -  Injury with Fall? 0 0 0 - -     Assessment & Plan  1. Mood disorder ([place])  - Ambulatory referral to Psychiatry  2. Vitamin D deficiency  Continue supplementation   3. Vitamin B12 deficiency  Continue supplementation   4. Premenstrual dysphoria  She will contact her gyn- Dr. Ouida Sills   5. Asthma, mild intermittent, well-controlled  - albuterol (VENTOLIN HFA) 108 (90 Base) MCG/ACT inhaler; Inhale 2 puffs into the lungs  every 6 (six) hours as needed for wheezing or shortness of breath.  Dispense: 1 each; Refill: 0  6. Need for immunization against influenza  - Flu Vaccine QUAD 36+ mos IM  7. Insomnia due to other mental disorder  - traZODone (DESYREL) 50 MG tablet; Take 0.5-1 tablets (25-50 mg total) by mouth at bedtime as needed for sleep.  Dispense: 30 tablet; Refill: 0  8. Anxiety  - busPIRone (BUSPAR) 10 MG tablet; Take 1 tablet (10 mg total) by mouth 2 (two) times daily.  Dispense: 60 tablet; Refill: 1

## 2020-10-17 ENCOUNTER — Other Ambulatory Visit: Payer: Self-pay | Admitting: Obstetrics and Gynecology

## 2020-10-17 DIAGNOSIS — F5105 Insomnia due to other mental disorder: Secondary | ICD-10-CM | POA: Diagnosis not present

## 2020-10-17 DIAGNOSIS — F4321 Adjustment disorder with depressed mood: Secondary | ICD-10-CM | POA: Diagnosis not present

## 2020-10-17 DIAGNOSIS — F3281 Premenstrual dysphoric disorder: Secondary | ICD-10-CM | POA: Diagnosis not present

## 2020-10-17 DIAGNOSIS — Z1331 Encounter for screening for depression: Secondary | ICD-10-CM | POA: Diagnosis not present

## 2020-10-17 DIAGNOSIS — Z8659 Personal history of other mental and behavioral disorders: Secondary | ICD-10-CM | POA: Insufficient documentation

## 2020-11-09 ENCOUNTER — Other Ambulatory Visit: Payer: Self-pay | Admitting: Psychiatry

## 2020-11-09 DIAGNOSIS — F5105 Insomnia due to other mental disorder: Secondary | ICD-10-CM | POA: Diagnosis not present

## 2020-11-09 DIAGNOSIS — F411 Generalized anxiety disorder: Secondary | ICD-10-CM | POA: Diagnosis not present

## 2020-11-09 DIAGNOSIS — F39 Unspecified mood [affective] disorder: Secondary | ICD-10-CM | POA: Diagnosis not present

## 2020-11-09 DIAGNOSIS — F1210 Cannabis abuse, uncomplicated: Secondary | ICD-10-CM | POA: Diagnosis not present

## 2020-12-02 ENCOUNTER — Other Ambulatory Visit: Payer: Self-pay | Admitting: Psychiatry

## 2020-12-02 DIAGNOSIS — F39 Unspecified mood [affective] disorder: Secondary | ICD-10-CM | POA: Diagnosis not present

## 2020-12-02 DIAGNOSIS — F5105 Insomnia due to other mental disorder: Secondary | ICD-10-CM | POA: Diagnosis not present

## 2020-12-02 DIAGNOSIS — F411 Generalized anxiety disorder: Secondary | ICD-10-CM | POA: Diagnosis not present

## 2020-12-02 DIAGNOSIS — F1210 Cannabis abuse, uncomplicated: Secondary | ICD-10-CM | POA: Diagnosis not present

## 2020-12-27 NOTE — Progress Notes (Signed)
Name: Ashley Patel   MRN: [id number]    DOB: Aug 13, 1981   Date:12/28/2020       Progress Note  Subjective  Chief Complaint  Follow up   HPI   Mood disorder  : she was admitted with suicidal thoughts back in 2019 and diagnosed with bipolar disorder, she took lithium and felt like it did not help with symptoms. She quit her job January 2020 and mood improved quickly after she quit it. She states that before she quit her job she had some auditory hallucinations and one episode of visual hallucinations but all symptoms resolved since. She states shifts abruptly through her cycles. She develops  irritability , feels very angry and has episodes of rage episodes that is present two weeks prior to cycles but much worse a few days before and she needs to walk away and go for a ride to avoid blowing up in front of the kids. She states two days prior to her cycles she has suicidal thoughts but she knows that it resolves once her cycles re-starts  . She has used Mirena in the past but she did not like it because she felt off and used to have UTI's. We gave seasonique back in March 2021, She went to GYN but sent back to psychiatrist. She states she is doing much better with Dr. Waylan Boga management. She is on Lamictal, Citalopram, trazodone and Buspar . She denies side effects of medications. She has not been able to afford seeing therapist. She started using marijuana in 2020 due to stress to cope with anxiety. She states she will try to quit now that her medications are working well   B12 deficiency/Vitamin D deficiency:  She is taking supplementation   Asthma Mild intermittent: she denies wheezing, cough or sob, not currently on medication, discussed importance of keeping albuterol at home   GERD: she has heartburn and indigestion about 3-4 times per week, usually at night, usually triggered by her diet and what time she eats. Controlled with Tums prn   Patient Active Problem List   Diagnosis Date Noted  .  Mood disorder ([place]) 12/28/2020  . Insomnia due to other mental disorder 12/28/2020  . Premenstrual dysphoria 03/25/2020  . Acid reflux 03/14/2018  . Panic attack 08/11/2015  . B12 deficiency 08/05/2015  . Acne 08/05/2015  . Blood glucose elevated 08/05/2015  . IBS (irritable bowel syndrome) 08/05/2015  . Numerous moles 08/05/2015  . Vitamin D deficiency 08/05/2015  . Family history of thyroid disease 08/05/2015  . Asthma without status asthmaticus 06/16/2010    Past Surgical History:  Procedure Laterality Date  . CHOLECYSTECTOMY    . TONSILLECTOMY AND ADENOIDECTOMY      Family History  Problem Relation Age of Onset  . Hypertension Mother   . Other Father        post-polio syndrome  . Sudden Cardiac Death Neg Hx     Social History   Tobacco Use  . Smoking status: Former Smoker    Packs/day: 0.50    Years: 5.00    Pack years: 2.50    Types: Cigarettes    Start date: 12/04/2003    Quit date: 11/04/2009    Years since quitting: 11.1  . Smokeless tobacco: Never Used  Substance Use Topics  . Alcohol use: Yes    Alcohol/week: 0.0 standard drinks    Comment: Drink 2x per year     Current Outpatient Medications:  .  albuterol (VENTOLIN HFA) 108 (90 Base) MCG/ACT inhaler,  Inhale 2 puffs into the lungs every 6 (six) hours as needed for wheezing or shortness of breath., Disp: 1 each, Rfl: 0 .  busPIRone (BUSPAR) 15 MG tablet, Take 15 mg by mouth 2 (two) times daily., Disp: , Rfl:  .  Cholecalciferol 25 MCG (1000 UT) tablet, Take by mouth., Disp: , Rfl:  .  citalopram (CELEXA) 20 MG tablet, Take 1 tablet by mouth daily., Disp: , Rfl:  .  Cyanocobalamin (VITAMIN B-12) 1000 MCG SUBL, Place 1 tablet under the tongue daily., Disp: , Rfl:  .  lamoTRIgine (LAMICTAL) 25 MG tablet, Take 75 mg by mouth in the morning., Disp: , Rfl:  .  Levonorgestrel-Ethinyl Estradiol (SEASONIQUE) 0.15-0.03 &0.01 MG tablet, Take 1 tablet by mouth daily., Disp: 1 Package, Rfl: 4 .  MELATONIN SL, Place  under the tongue., Disp: , Rfl:  .  traZODone (DESYREL) 100 MG tablet, Take 100-150 mg by mouth at bedtime., Disp: , Rfl:   Allergies  Allergen Reactions  . Amoxil [Amoxicillin]     Severe yeast     I personally reviewed active problem list, medication list, allergies, family history, social history, health maintenance with the patient/caregiver today.   ROS  Constitutional: Negative for fever or weight change.  Respiratory: Negative for cough and shortness of breath.   Cardiovascular: Negative for chest pain or palpitations.  Gastrointestinal: Negative for abdominal pain, no bowel changes.  Musculoskeletal: Negative for gait problem or joint swelling.  Skin: Negative for rash.  Neurological: Negative for dizziness or headache.  No other specific complaints in a complete review of systems (except as listed in HPI above).  Objective  Vitals:   12/28/20 0831  BP: 118/62  Pulse: 67  Resp: 16  Temp: 98 F (36.7 C)  TempSrc: Oral  SpO2: 99%  Weight: 168 lb 9.6 oz (76.5 kg)  Height: 5\' 9"  (1.753 m)    Body mass index is 24.9 kg/m.  Physical Exam  Constitutional: Patient appears well-developed and well-nourished.  No distress.  HEENT: head atraumatic, normocephalic, pupils equal and reactive to light,  neck supple Cardiovascular: Normal rate, regular rhythm and normal heart sounds.  No murmur heard. No BLE edema. Pulmonary/Chest: Effort normal and breath sounds normal. No respiratory distress. Abdominal: Soft.  There is no tenderness. Psychiatric: Patient has a normal mood and affect. behavior is normal. Judgment and thought content normal.   PHQ2/9: Depression screen [facility] 2/9 12/28/2020 09/26/2020 03/25/2020 11/12/2018 10/14/2018  Decreased Interest 2 3 0 1 0  Down, Depressed, Hopeless 2 3 2 3 2   PHQ - 2 Score 4 6 2 4 2   Altered sleeping 3 3 2  0 2  Tired, decreased energy 1 3 2  0 0  Change in appetite 2 0 2 3 0  Feeling bad or failure about yourself  0 0 0 0 0   Trouble concentrating 3 3 3 3  0  Moving slowly or fidgety/restless 0 0 0 0 0  Suicidal thoughts 0 0 0 0 0  PHQ-9 Score 13 15 11 10 4   Difficult doing work/chores - Extremely dIfficult Somewhat difficult - Not difficult at all  Some recent data might be hidden    phq 9 is positive   Fall Risk: Fall Risk  12/28/2020 09/26/2020 03/25/2020 11/12/2018 10/14/2018  Falls in the past year? 0 0 0 0 0  Number falls in past yr: 0 0 0 0 -  Injury with Fall? 0 0 0 0 -    Functional Status Survey: Is the patient deaf  or have difficulty hearing?: No Does the patient have difficulty seeing, even when wearing glasses/contacts?: No Does the patient have difficulty concentrating, remembering, or making decisions?: No Does the patient have difficulty walking or climbing stairs?: No Does the patient have difficulty dressing or bathing?: No Does the patient have difficulty doing errands alone such as visiting a doctor's office or shopping?: No    Assessment & Plan  1. Insomnia due to other mental disorder   2. Mood disorder ([place])  Keep follow up with Dr. Nicolasa Ducking, she feels better  3. Vitamin B12 deficiency   4. Vitamin D deficiency  Continue supplementation   5. Asthma, mild intermittent, well-controlled   6. GAD (generalized anxiety disorder)  Doing better

## 2020-12-28 ENCOUNTER — Ambulatory Visit (INDEPENDENT_AMBULATORY_CARE_PROVIDER_SITE_OTHER): Payer: BC Managed Care – PPO | Admitting: Family Medicine

## 2020-12-28 ENCOUNTER — Other Ambulatory Visit: Payer: Self-pay

## 2020-12-28 ENCOUNTER — Encounter: Payer: Self-pay | Admitting: Family Medicine

## 2020-12-28 VITALS — BP 118/62 | HR 67 | Temp 98.0°F | Resp 16 | Ht 69.0 in | Wt 168.6 lb

## 2020-12-28 DIAGNOSIS — E538 Deficiency of other specified B group vitamins: Secondary | ICD-10-CM

## 2020-12-28 DIAGNOSIS — E559 Vitamin D deficiency, unspecified: Secondary | ICD-10-CM | POA: Diagnosis not present

## 2020-12-28 DIAGNOSIS — F5105 Insomnia due to other mental disorder: Secondary | ICD-10-CM | POA: Diagnosis not present

## 2020-12-28 DIAGNOSIS — F39 Unspecified mood [affective] disorder: Secondary | ICD-10-CM

## 2020-12-28 DIAGNOSIS — F99 Mental disorder, not otherwise specified: Secondary | ICD-10-CM

## 2020-12-28 DIAGNOSIS — F411 Generalized anxiety disorder: Secondary | ICD-10-CM

## 2020-12-28 DIAGNOSIS — J4520 Mild intermittent asthma, uncomplicated: Secondary | ICD-10-CM

## 2021-01-10 ENCOUNTER — Other Ambulatory Visit: Payer: Self-pay | Admitting: Psychiatry

## 2021-01-10 DIAGNOSIS — F1210 Cannabis abuse, uncomplicated: Secondary | ICD-10-CM | POA: Diagnosis not present

## 2021-01-10 DIAGNOSIS — F39 Unspecified mood [affective] disorder: Secondary | ICD-10-CM | POA: Diagnosis not present

## 2021-01-10 DIAGNOSIS — F411 Generalized anxiety disorder: Secondary | ICD-10-CM | POA: Diagnosis not present

## 2021-01-10 DIAGNOSIS — F5105 Insomnia due to other mental disorder: Secondary | ICD-10-CM | POA: Diagnosis not present

## 2021-02-06 ENCOUNTER — Other Ambulatory Visit: Payer: Self-pay | Admitting: Psychiatry

## 2021-02-06 DIAGNOSIS — F411 Generalized anxiety disorder: Secondary | ICD-10-CM | POA: Diagnosis not present

## 2021-02-06 DIAGNOSIS — F5105 Insomnia due to other mental disorder: Secondary | ICD-10-CM | POA: Diagnosis not present

## 2021-02-06 DIAGNOSIS — F39 Unspecified mood [affective] disorder: Secondary | ICD-10-CM | POA: Diagnosis not present

## 2021-02-06 DIAGNOSIS — F1210 Cannabis abuse, uncomplicated: Secondary | ICD-10-CM | POA: Diagnosis not present

## 2021-03-23 ENCOUNTER — Other Ambulatory Visit: Payer: Self-pay

## 2021-03-23 MED ORDER — LAMOTRIGINE 25 MG PO TABS
50.0000 mg | ORAL_TABLET | ORAL | 0 refills | Status: DC
Start: 2021-03-23 — End: 2021-04-10
  Filled 2021-03-23: qty 60, 30d supply, fill #0

## 2021-03-27 ENCOUNTER — Other Ambulatory Visit: Payer: Self-pay

## 2021-03-27 MED FILL — Clonazepam Tab 0.5 MG: ORAL | 28 days supply | Qty: 5 | Fill #0 | Status: AC

## 2021-03-27 MED FILL — Lamotrigine Tab 200 MG: ORAL | 30 days supply | Qty: 30 | Fill #0 | Status: AC

## 2021-04-10 ENCOUNTER — Other Ambulatory Visit: Payer: Self-pay

## 2021-04-10 DIAGNOSIS — F1210 Cannabis abuse, uncomplicated: Secondary | ICD-10-CM | POA: Diagnosis not present

## 2021-04-10 DIAGNOSIS — F39 Unspecified mood [affective] disorder: Secondary | ICD-10-CM | POA: Diagnosis not present

## 2021-04-10 DIAGNOSIS — F5105 Insomnia due to other mental disorder: Secondary | ICD-10-CM | POA: Diagnosis not present

## 2021-04-10 DIAGNOSIS — F411 Generalized anxiety disorder: Secondary | ICD-10-CM | POA: Diagnosis not present

## 2021-04-10 MED ORDER — CITALOPRAM HYDROBROMIDE 20 MG PO TABS
ORAL_TABLET | ORAL | 0 refills | Status: DC
  Filled 2021-04-10: qty 90, 90d supply, fill #0

## 2021-04-10 MED ORDER — BUSPIRONE HCL 15 MG PO TABS
ORAL_TABLET | ORAL | 0 refills | Status: DC
  Filled 2021-04-10: qty 180, 90d supply, fill #0

## 2021-04-10 MED ORDER — LAMOTRIGINE 200 MG PO TABS
ORAL_TABLET | Freq: Every day | ORAL | 0 refills | Status: DC
  Filled 2021-05-11: qty 90, 90d supply, fill #0

## 2021-04-10 MED ORDER — TRAZODONE HCL 100 MG PO TABS
ORAL_TABLET | ORAL | 0 refills | Status: DC
  Filled 2021-04-10: qty 180, 90d supply, fill #0

## 2021-04-10 MED ORDER — CLONAZEPAM 0.5 MG PO TABS: ORAL_TABLET | ORAL | 2 refills | Status: DC

## 2021-04-10 MED ORDER — LAMOTRIGINE 25 MG PO TABS
ORAL_TABLET | ORAL | 0 refills | Status: DC
  Filled 2021-05-11: qty 180, 90d supply, fill #0

## 2021-04-13 ENCOUNTER — Encounter: Payer: Self-pay | Admitting: Family Medicine
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
## 2021-04-24 ENCOUNTER — Encounter: Payer: Self-pay | Admitting: Family Medicine

## 2021-04-24 ENCOUNTER — Ambulatory Visit (INDEPENDENT_AMBULATORY_CARE_PROVIDER_SITE_OTHER): Payer: BC Managed Care – PPO | Admitting: Family Medicine

## 2021-04-24 ENCOUNTER — Other Ambulatory Visit: Payer: Self-pay

## 2021-04-24 VITALS — BP 116/64 | HR 85 | Temp 98.1°F | Resp 16 | Ht 69.0 in | Wt 164.0 lb

## 2021-04-24 DIAGNOSIS — F3281 Premenstrual dysphoric disorder: Secondary | ICD-10-CM | POA: Diagnosis not present

## 2021-04-24 DIAGNOSIS — Z0000 Encounter for general adult medical examination without abnormal findings: Secondary | ICD-10-CM

## 2021-04-24 DIAGNOSIS — Z23 Encounter for immunization: Secondary | ICD-10-CM | POA: Diagnosis not present

## 2021-04-24 NOTE — Addendum Note (Signed)
Addended by: Steele Sizer F on: 04/24/2021 12:13 PM   Modules accepted: Orders

## 2021-04-27 ENCOUNTER — Ambulatory Visit: Payer: Self-pay | Admitting: *Deleted

## 2021-04-27 NOTE — Telephone Encounter (Signed)
I returned pt's call.   She think she may be severely dehydrated from having diarrhea for 7 days.  See notes below.  She has decided to go on to the ED.   Her daughter is graduating from high school this evening at 5:00.   I really want to go but I feel like I really need to go on to the ED too.   I think I'll go on to the ED.  I sent my notes to [facility] for Dr. Ancil Boozer.  Reason for Disposition . Patient sounds very sick or weak to the triager    Diarrhea for 7 days and has lost 12 lbs this week as a result.  Urine very dark and smells strong.  Answer Assessment - Initial Assessment Questions 1. LOCATION: "Where does it hurt?"      She been having diarrhea for 7 days.  Did have body aches they are gone now.   No fever.   I'm not peeing.   I'm drinking 3-4 bottles of water a day.   My urine smells and is dark.   I'm not eating.   I've lost 12 lbs this week.    I have IBS (irritable bowel syndrome) but I've never had diarrhea like this before.   Yesterday I was dizzy but not today.  Her daughter is graduating from high school this evening at 5:00.    She is trying to decide whether to go to the graduation then straight to the ED after the ceremony or go now.   "I'll decide but I may go on to the ED now". 2. RADIATION: "Does the pain shoot anywhere else?" (e.g., chest, back)     No just cramping with the diarrhea 3. ONSET: "When did the pain begin?" (e.g., minutes, hours or days ago)      Been going on for 7 days now 4. SUDDEN: "Gradual or sudden onset?"     Suddenly but has gotten worse. 5. PATTERN "Does the pain come and go, or is it constant?"    - If constant: "Is it getting better, staying the same, or worsening?"      (Note: Constant means the pain never goes away completely; most serious pain is constant and it progresses)     - If intermittent: "How long does it last?" "Do you have pain now?"     (Note: Intermittent means the pain goes away completely between  bouts)     7 straight days now. 6. SEVERITY: "How bad is the pain?"  (e.g., Scale 1-10; mild, moderate, or severe)   - MILD (1-3): doesn't interfere with normal activities, abdomen soft and not tender to touch    - MODERATE (4-7): interferes with normal activities or awakens from sleep, abdomen tender to touch    - SEVERE (8-10): excruciating pain, doubled over, unable to do any normal activities      Severe she can't eat and is trying to drink a lot of water.    7. RECURRENT SYMPTOM: "Have you ever had this type of stomach pain before?" If Yes, ask: "When was the last time?" and "What happened that time?"      No 8. CAUSE: "What do you think is causing the stomach pain?"     Maybe my IBS but I'm not sure.   I've never had IBS like this before. 9. RELIEVING/AGGRAVATING FACTORS: "What makes it better or worse?" (e.g., movement, antacids, bowel movement)     Nothing has helped.   "  I feel so dehydrated". 10. OTHER SYMPTOMS: "Do you have any other symptoms?" (e.g., back pain, diarrhea, fever, urination pain, vomiting)       Urine is very dark and smells strong and I'm not hardly peeing at all during the day even though I'm trying to drink a lot of water. 11. PREGNANCY: "Is there any chance you are pregnant?" "When was your last menstrual period?"       Not asked  Protocols used: ABDOMINAL PAIN - [facility]

## 2021-04-28 ENCOUNTER — Emergency Department (HOSPITAL_COMMUNITY): Payer: BC Managed Care – PPO

## 2021-04-28 ENCOUNTER — Encounter (HOSPITAL_COMMUNITY): Payer: Self-pay

## 2021-04-28 ENCOUNTER — Emergency Department (HOSPITAL_COMMUNITY)
Admission: EM | Admit: 2021-04-28 | Discharge: 2021-04-29 | Disposition: A | Discharge status: Home / Self Care | Payer: BC Managed Care – PPO | Attending: Emergency Medicine | Admitting: Emergency Medicine

## 2021-04-28 ENCOUNTER — Other Ambulatory Visit: Payer: Self-pay

## 2021-04-28 DIAGNOSIS — R109 Unspecified abdominal pain: Secondary | ICD-10-CM | POA: Diagnosis not present

## 2021-04-28 DIAGNOSIS — S20361A Insect bite (nonvenomous) of right front wall of thorax, initial encounter: Secondary | ICD-10-CM | POA: Diagnosis not present

## 2021-04-28 DIAGNOSIS — R0789 Other chest pain: Secondary | ICD-10-CM | POA: Diagnosis not present

## 2021-04-28 DIAGNOSIS — Z87891 Personal history of nicotine dependence: Secondary | ICD-10-CM | POA: Diagnosis not present

## 2021-04-28 DIAGNOSIS — W57XXXA Bitten or stung by nonvenomous insect and other nonvenomous arthropods, initial encounter: Secondary | ICD-10-CM | POA: Insufficient documentation

## 2021-04-28 DIAGNOSIS — R197 Diarrhea, unspecified: Secondary | ICD-10-CM | POA: Diagnosis not present

## 2021-04-28 DIAGNOSIS — J45909 Unspecified asthma, uncomplicated: Secondary | ICD-10-CM | POA: Diagnosis not present

## 2021-04-28 DIAGNOSIS — R1011 Right upper quadrant pain: Secondary | ICD-10-CM | POA: Diagnosis not present

## 2021-04-28 DIAGNOSIS — Z9049 Acquired absence of other specified parts of digestive tract: Secondary | ICD-10-CM | POA: Diagnosis not present

## 2021-04-28 DIAGNOSIS — N200 Calculus of kidney: Secondary | ICD-10-CM | POA: Diagnosis not present

## 2021-04-28 LAB — CBC WITH DIFFERENTIAL/PLATELET
Abs Immature Granulocytes: 0.07 10*3/uL (ref 0.00–0.07)
Basophils Absolute: 0.1 10*3/uL (ref 0.0–0.1)
Basophils Relative: 1 %
Eosinophils Absolute: 0.3 10*3/uL (ref 0.0–0.5)
Eosinophils Relative: 4 %
HCT: 44.2 % (ref 36.0–46.0)
Hemoglobin: 14 g/dL (ref 12.0–15.0)
Immature Granulocytes: 1 %
Lymphocytes Relative: 37 %
Lymphs Abs: 2.6 10*3/uL (ref 0.7–4.0)
MCH: 28.7 pg (ref 26.0–34.0)
MCHC: 31.7 g/dL (ref 30.0–36.0)
MCV: 90.6 fL (ref 80.0–100.0)
Monocytes Absolute: 0.8 10*3/uL (ref 0.1–1.0)
Monocytes Relative: 12 %
Neutro Abs: 3.1 10*3/uL (ref 1.7–7.7)
Neutrophils Relative %: 45 %
Platelets: 300 10*3/uL (ref 150–400)
RBC: 4.88 MIL/uL (ref 3.87–5.11)
RDW: 11.9 % (ref 11.5–15.5)
WBC: 6.9 10*3/uL (ref 4.0–10.5)
nRBC: 0 % (ref 0.0–0.2)

## 2021-04-28 LAB — URINALYSIS, ROUTINE W REFLEX MICROSCOPIC
Bilirubin Urine: NEGATIVE
Glucose, UA: NEGATIVE mg/dL
Hgb urine dipstick: NEGATIVE
Ketones, ur: 5 mg/dL — AB
Leukocytes,Ua: NEGATIVE
Nitrite: NEGATIVE
Protein, ur: NEGATIVE mg/dL
Specific Gravity, Urine: 1.027 (ref 1.005–1.030)
pH: 5 (ref 5.0–8.0)

## 2021-04-28 LAB — COMPREHENSIVE METABOLIC PANEL
ALT: 16 U/L (ref 0–44)
AST: 21 U/L (ref 15–41)
Albumin: 4 g/dL (ref 3.5–5.0)
Alkaline Phosphatase: 76 U/L (ref 38–126)
Anion gap: 6 (ref 5–15)
BUN: 13 mg/dL (ref 6–20)
CO2: 27 mmol/L (ref 22–32)
Calcium: 9.6 mg/dL (ref 8.9–10.3)
Chloride: 106 mmol/L (ref 98–111)
Creatinine, Ser: 0.95 mg/dL (ref 0.44–1.00)
GFR, Estimated: >60 mL/min (ref >60)
Glucose, Bld: 87 mg/dL (ref 70–99)
Potassium: 3.7 mmol/L (ref 3.5–5.1)
Sodium: 139 mmol/L (ref 135–145)
Total Bilirubin: 0.9 mg/dL (ref 0.3–1.2)
Total Protein: 6.9 g/dL (ref 6.5–8.1)

## 2021-04-28 LAB — LIPASE, BLOOD: Lipase: 34 U/L (ref 11–51)

## 2021-04-28 LAB — I-STAT BETA HCG BLOOD, ED (MC, WL, AP ONLY): I-stat hCG, quantitative: <5 m[IU]/mL (ref <5)

## 2021-04-28 IMAGING — CT CT ABD-PELV W/O CM
2 of 6 series · 14 of 46 positions shown, 18 images · non-contrast
Comparison: No priors.

CLINICAL DATA: 40-year-old female with history of left-sided flank.
Diffuse right upper quadrant and lower abdominal pain with diarrhea.

EXAM:
CT ABDOMEN AND PELVIS WITHOUT CONTRAST
TECHNIQUE: Multidetector CT imaging of the abdomen and pelvis was performed
following the standard protocol without IV contrast.

[Series 3: ap without · axial · non-contrast · 0.69mm/px · z∈[+1024,+1354]mm · 11 of 76 slices shown, 15 images]
[im 5/76  soft-tissue]
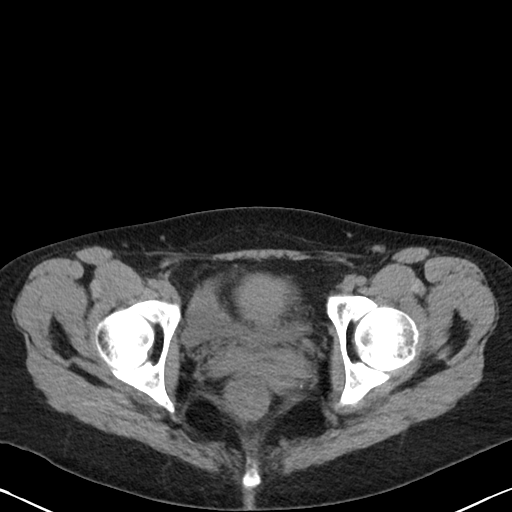
[im 5/76  bone]
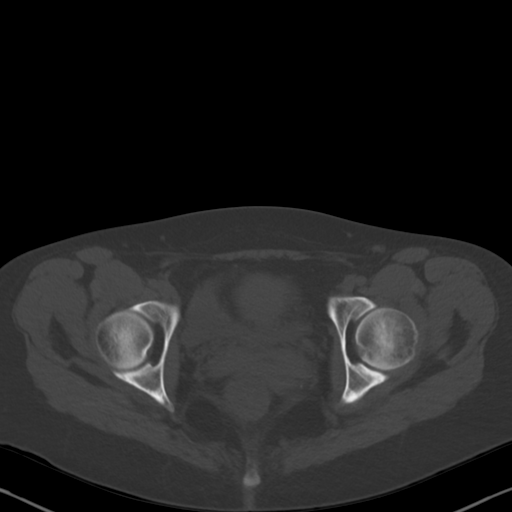
[im 14/76  soft-tissue]
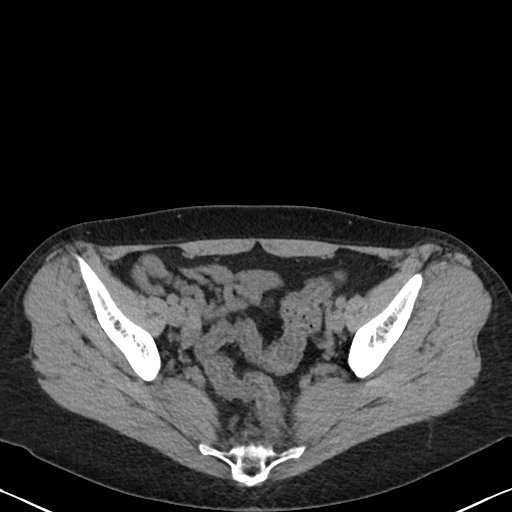
[im 23/76  soft-tissue]
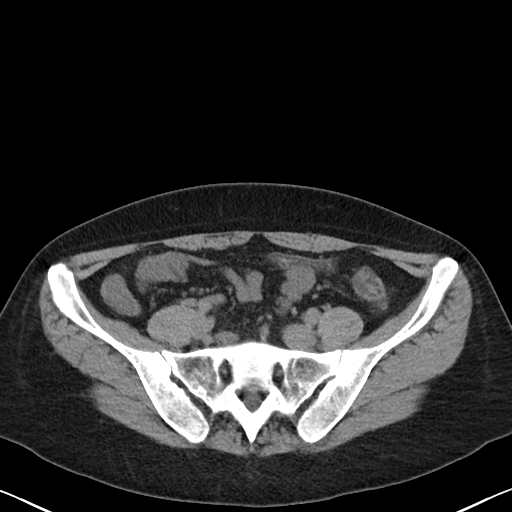
[im 31/76  soft-tissue]
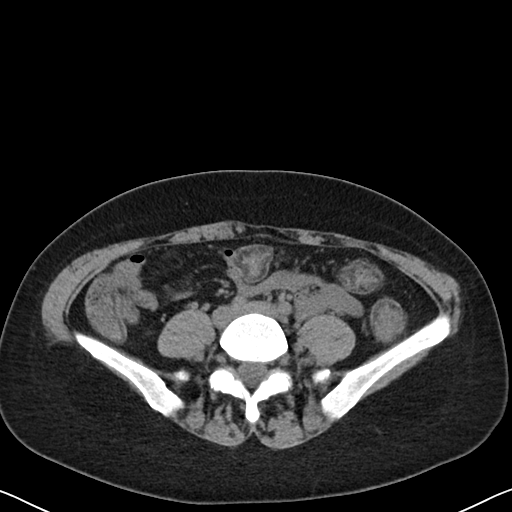
[im 40/76  soft-tissue]
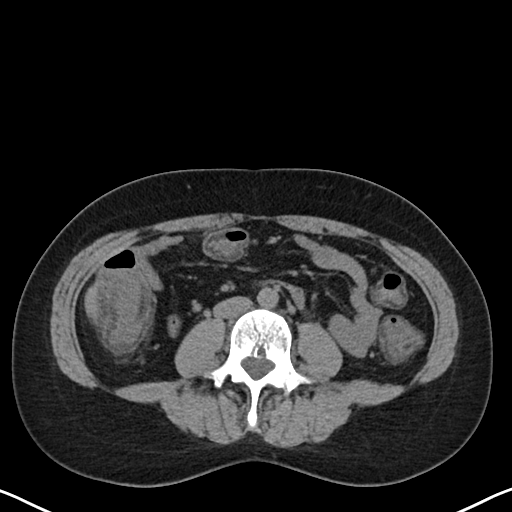
[im 45/76  soft-tissue]
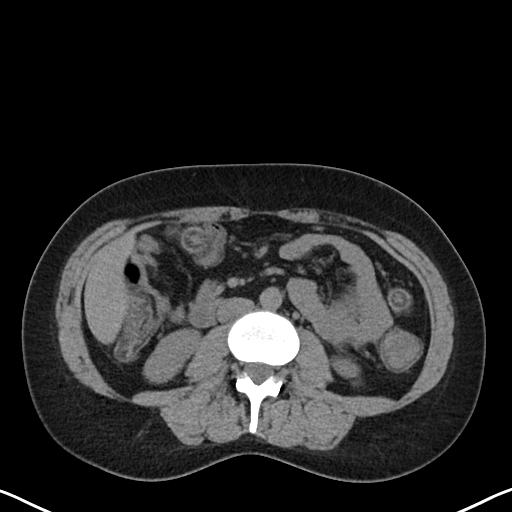
[im 53/76  soft-tissue]
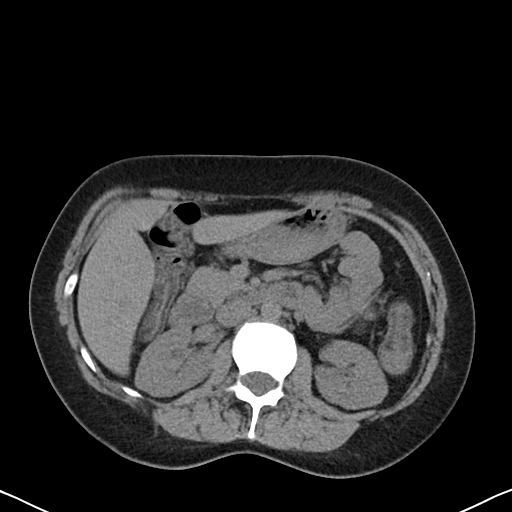
[im 58/76  lung]
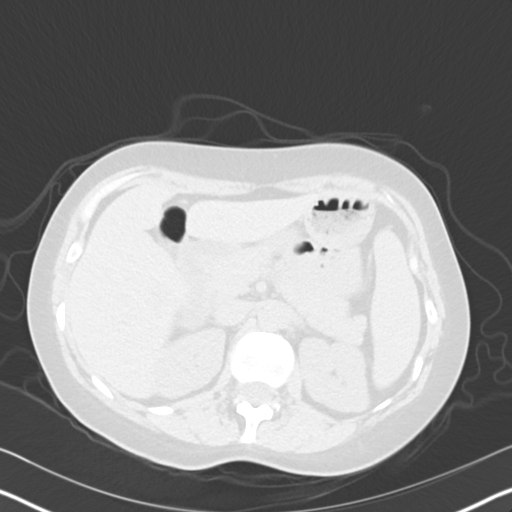
[im 62/76  soft-tissue]
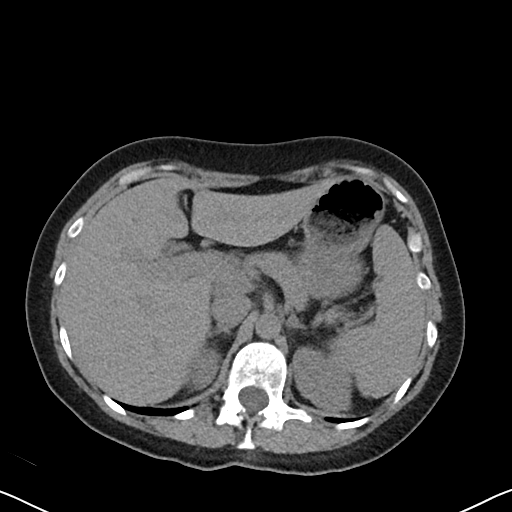
[im 62/76  lung]
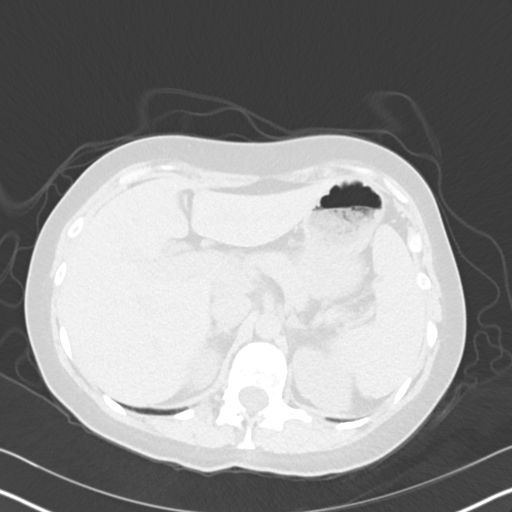
[im 67/76  lung]
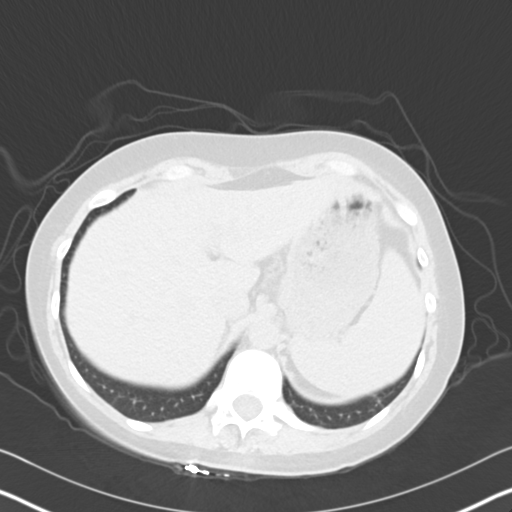
[im 71/76  soft-tissue]
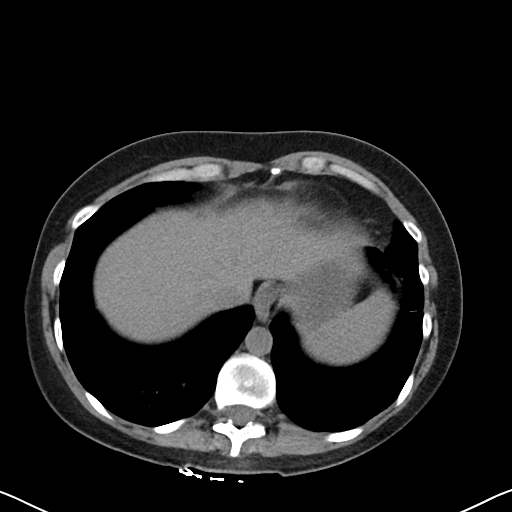
[im 71/76  lung]
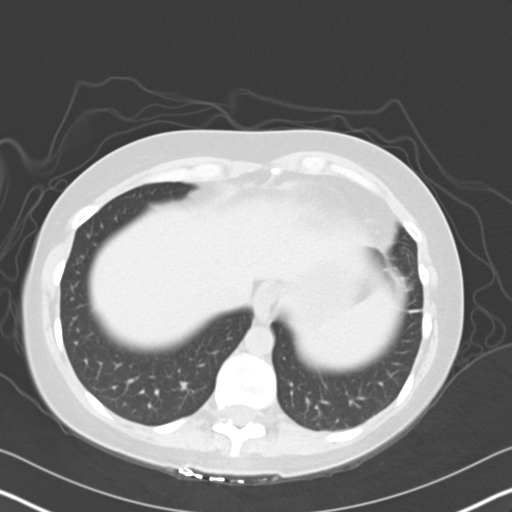
[im 71/76  bone]
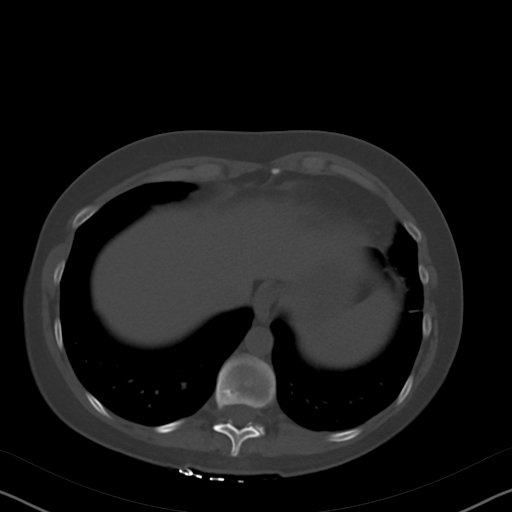

[Series 6: cor · coronal · 0.72mm/px · 3 of 90 slices shown]
[im 30/90  soft-tissue]
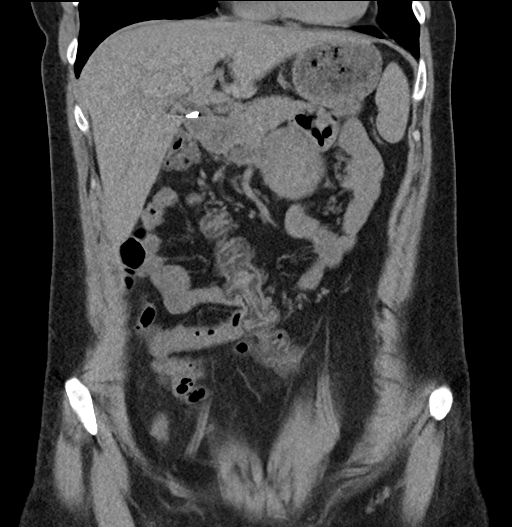
[im 40/90  soft-tissue]
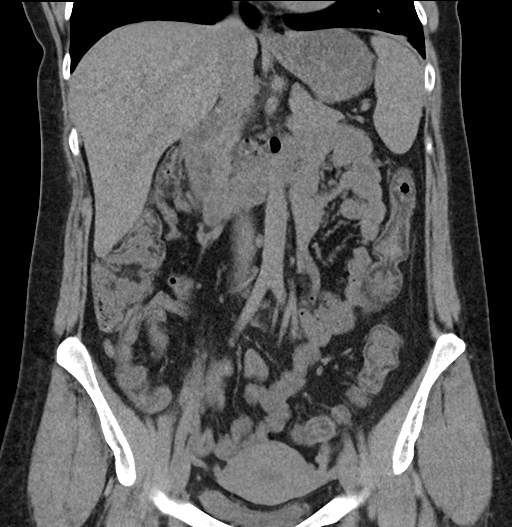
[im 50/90  soft-tissue]
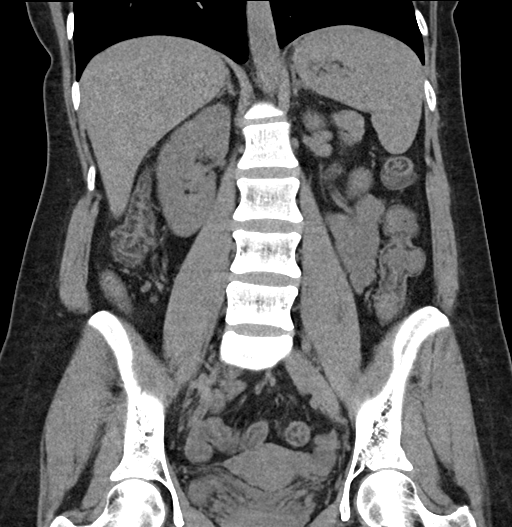

[14 of 46 positions shown; findings below may reference images not displayed]

FINDINGS: Lower chest: Unremarkable.

Hepatobiliary: No definite cystic or solid hepatic lesions are
confidently identified on today's noncontrast CT examination. Status
post cholecystectomy.

Pancreas: No definite pancreatic mass or peripancreatic fluid
collections or inflammatory changes are noted on today's noncontrast
CT examination.

Spleen: Unremarkable.

Adrenals/Urinary Tract: 1-2 mm nonobstructive calculus in the
interpolar collecting system of the right kidney. No additional
calculi are noted within the collecting system of the left kidney,
along the course of either ureter, or within the lumen of the
urinary bladder. No hydroureteronephrosis. Unenhanced appearance of
the kidneys and bilateral adrenal glands is normal. Urinary bladder
is nearly completely decompressed, but otherwise unremarkable in
appearance.

Stomach/Bowel: Unenhanced appearance of the stomach is normal. No
pathologic dilatation of small bowel or colon. Normal appendix.

Vascular/Lymphatic: No atherosclerotic calcifications in the
abdominal aorta or pelvic arteries. No lymphadenopathy noted in the
abdomen or pelvis.

Reproductive: Unenhanced appearance of the uterus and ovaries is
unremarkable.

Other: No significant volume of ascites.  No pneumoperitoneum.

Musculoskeletal: There are no aggressive appearing lytic or blastic
lesions noted in the visualized portions of the skeleton.
IMPRESSION: 1. No acute findings are noted in the abdomen or pelvis to account
for the patient's symptoms.
2. Tiny 1-2 mm nonobstructive calculus in the interpolar collecting
system of the right kidney. No ureteral stones or findings of
urinary tract obstruction are noted at this time.

## 2021-04-28 NOTE — ED Provider Notes (Signed)
[facility] EMERGENCY DEPARTMENT Provider Note  CSN: [id number] Arrival date & time: 04/28/21 1409  Chief Complaint(s) Abdominal Pain  HPI Ashley Patel is a 40 y.o. female   The history is provided by the patient.  Abdominal Cramping This is a new problem. Episode onset: 1 week. The problem occurs daily. The problem has been gradually improving. Pertinent negatives include no chest pain, no headaches and no shortness of breath. Nothing aggravates the symptoms. Nothing relieves the symptoms. She has tried nothing for the symptoms.  Diarrhea Quality:  Watery Severity:  Moderate Duration:  1 week Progression:  Improving Associated symptoms: no fever, no headaches and no vomiting   Risk factors: no recent antibiotic use, no sick contacts, no suspicious food intake and no travel to endemic areas    Patient also reports that she had a tick bite 2 to 3 weeks prior to symptom onset.  She believes the tick was on for less than 24 hours because it was on the right side of her chest and she takes showers daily and did not notice the tick the day prior.  Past Medical History Past Medical History:  Diagnosis Date  . Acne   . Anxiety   . Asthma   . Breast lump on right side at 12 o'clock position   . Depression   . Fatigue   . Hyperglycemia   . Insomnia   . Irritable bowel syndrome   . Numerous moles   . Vitamin B 12 deficiency   . Vitamin D deficiency    Patient Active Problem List   Diagnosis Date Noted  . Mood disorder ([place]) 12/28/2020  . Insomnia due to other mental disorder 12/28/2020  . Premenstrual dysphoria 03/25/2020  . Acid reflux 03/14/2018  . Panic attack 08/11/2015  . B12 deficiency 08/05/2015  . Acne 08/05/2015  . Blood glucose elevated 08/05/2015  . IBS (irritable bowel syndrome) 08/05/2015  . Numerous moles 08/05/2015  . Vitamin D deficiency 08/05/2015  . Family history of thyroid disease 08/05/2015  . Asthma without status asthmaticus  06/16/2010   Home Medication(s) Prior to Admission medications   Medication Sig Start Date End Date Taking? Authorizing Provider  albuterol (VENTOLIN HFA) 108 (90 Base) MCG/ACT inhaler INHALE 2 PUFFS INTO THE LUNGS EVERY 6 (SIX) HOURS AS NEEDED FOR WHEEZING OR SHORTNESS OF BREATH 09/26/20 09/26/21  Steele Sizer, MD  Cholecalciferol 25 MCG (1000 UT) tablet Take by mouth.    [provider]  citalopram (CELEXA) 20 MG tablet Take 1 tablet daily with breakfast Patient not taking: Reported on 04/24/2021 04/10/21     clonazePAM (KLONOPIN) 0.5 MG tablet TAKE 1 TABLET BY MOUTH ONCE DAILY DURING WEEK PRIOR TO MENSES Patient not taking: Reported on 04/24/2021 01/10/21 07/09/21  Chauncey Mann, MD  Cyanocobalamin (VITAMIN B-12) 1000 MCG SUBL Place 1 tablet under the tongue daily.    [provider]  lamoTRIgine (LAMICTAL) 200 MG tablet Take 1 tablet by mouth daily 04/10/21     lamoTRIgine (LAMICTAL) 25 MG tablet Take 2 by mouth at bedtime Patient not taking: Reported on 04/24/2021 04/10/21     Levonorgestrel-Ethinyl Estradiol (AMETHIA) 0.15-0.03 &0.01 MG tablet TAKE 1 TABLET BY MOUTH DAILY. 03/25/20 03/25/21  Steele Sizer, MD  MELATONIN SL Place under the tongue.    [provider]  Past Surgical History Past Surgical History:  Procedure Laterality Date  . CHOLECYSTECTOMY    . TONSILLECTOMY AND ADENOIDECTOMY     Family History Family History  Problem Relation Age of Onset  . Hypertension Mother   . Other Father        post-polio syndrome  . Sudden Cardiac Death Neg Hx     Social History Social History   Tobacco Use  . Smoking status: Former Smoker    Packs/day: 0.50    Years: 5.00    Pack years: 2.50    Types: Cigarettes    Start date: 12/04/2003    Quit date: 11/04/2009    Years since quitting: 11.4  . Smokeless tobacco: Never Used   Vaping Use  . Vaping Use: Never used  Substance Use Topics  . Alcohol use: Yes    Alcohol/week: 0.0 standard drinks    Comment: Drink 2x per year  . Drug use: Yes    Types: Marijuana   Allergies Amoxil [amoxicillin]  Review of Systems Review of Systems  Constitutional: Negative for fever.  Respiratory: Negative for shortness of breath.   Cardiovascular: Negative for chest pain.  Gastrointestinal: Positive for diarrhea. Negative for vomiting.  Neurological: Negative for headaches.   All other systems are reviewed and are negative for acute change except as noted in the HPI  Physical Exam Vital Signs  I have reviewed the triage vital signs BP 127/63 (BP Location: Right Arm)   Pulse 61   Temp 98.6 F (37 C) (Oral)   Resp 16   SpO2 100%   Physical Exam Vitals reviewed.  Constitutional:      General: She is not in acute distress.    Appearance: She is well-developed. She is not diaphoretic.  HENT:     Head: Normocephalic and atraumatic.     Right Ear: External ear normal.     Left Ear: External ear normal.     Nose: Nose normal.  Eyes:     General: No scleral icterus.    Conjunctiva/sclera: Conjunctivae normal.  Neck:     Trachea: Phonation normal.  Cardiovascular:     Rate and Rhythm: Normal rate and regular rhythm.  Pulmonary:     Effort: Pulmonary effort is normal. No respiratory distress.     Breath sounds: No stridor.  Abdominal:     General: There is no distension.     Tenderness: There is no abdominal tenderness.  Musculoskeletal:        General: Normal range of motion.     Cervical back: Normal range of motion.  Neurological:     Mental Status: She is alert and oriented to person, place, and time.  Psychiatric:        Behavior: Behavior normal.     ED Results and Treatments Labs (all labs ordered are listed, but only abnormal results are displayed) Labs Reviewed  URINALYSIS, ROUTINE W REFLEX MICROSCOPIC - Abnormal; Notable for the following  components:      Result Value   APPearance TURBID (*)    Ketones, ur 5 (*)    Bacteria, UA MANY (*)    All other components within normal limits  URINE CULTURE  CBC WITH DIFFERENTIAL/PLATELET  COMPREHENSIVE METABOLIC PANEL  LIPASE, BLOOD  LYME DISEASE DNA BY PCR(BORRELIA BURG)  B. BURGDORFI ANTIBODIES  ROCKY MTN SPOTTED FVR ABS PNL(IGG+IGM)  I-STAT BETA HCG BLOOD, ED ([facility], [facility], [facility] ONLY)  EKG  EKG Interpretation  Date/Time:    Ventricular Rate:    PR Interval:    QRS Duration:   QT Interval:    QTC Calculation:   R Axis:     Text Interpretation:        Radiology CT ABDOMEN PELVIS WO CONTRAST  Result Date: 04/28/2021 CLINICAL DATA:  41 year old female with history of left-sided flank. Diffuse right upper quadrant and lower abdominal pain with diarrhea. EXAM: CT ABDOMEN AND PELVIS WITHOUT CONTRAST TECHNIQUE: Multidetector CT imaging of the abdomen and pelvis was performed following the standard protocol without IV contrast. COMPARISON:  No priors. FINDINGS: Lower chest: Unremarkable. Hepatobiliary: No definite cystic or solid hepatic lesions are confidently identified on today's noncontrast CT examination. Status post cholecystectomy. Pancreas: No definite pancreatic mass or peripancreatic fluid collections or inflammatory changes are noted on today's noncontrast CT examination. Spleen: Unremarkable. Adrenals/Urinary Tract: 1-2 mm nonobstructive calculus in the interpolar collecting system of the right kidney. No additional calculi are noted within the collecting system of the left kidney, along the course of either ureter, or within the lumen of the urinary bladder. No hydroureteronephrosis. Unenhanced appearance of the kidneys and bilateral adrenal glands is normal. Urinary bladder is nearly completely decompressed, but otherwise unremarkable in appearance.  Stomach/Bowel: Unenhanced appearance of the stomach is normal. No pathologic dilatation of small bowel or colon. Normal appendix. Vascular/Lymphatic: No atherosclerotic calcifications in the abdominal aorta or pelvic arteries. No lymphadenopathy noted in the abdomen or pelvis. Reproductive: Unenhanced appearance of the uterus and ovaries is unremarkable. Other: No significant volume of ascites.  No pneumoperitoneum. Musculoskeletal: There are no aggressive appearing lytic or blastic lesions noted in the visualized portions of the skeleton. IMPRESSION: 1. No acute findings are noted in the abdomen or pelvis to account for the patient's symptoms. 2. Tiny 1-2 mm nonobstructive calculus in the interpolar collecting system of the right kidney. No ureteral stones or findings of urinary tract obstruction are noted at this time. Electronically Signed   By: Vinnie Langton M.D.   On: 04/28/2021 15:58    Pertinent labs & imaging results that were available during my care of the patient were reviewed by me and considered in my medical decision making (see chart for details).  Medications Ordered in ED Medications - No data to display                                                                                                                                  Procedures Procedures  (including critical care time)  Medical Decision Making / ED Course I have reviewed the nursing notes for this encounter and the patient's prior records (if available in EHR or on provided paperwork).   Ashley Patel was evaluated in Emergency Department on 04/29/2021 for the symptoms described in the history of present illness. She was evaluated in the context of the global COVID-19 pandemic, which necessitated consideration that the patient might  be at risk for infection with the SARS-CoV-2 virus that causes COVID-19. Institutional protocols and algorithms that pertain to the evaluation of patients at risk for COVID-19 are in a  state of rapid change based on information released by regulatory bodies including the CDC and federal and state organizations. These policies and algorithms were followed during the patient's care in the ED.  Patient presents with 1 week of abdominal cramping and diarrhea. She is well-appearing well-hydrated, nontoxic. Abdomen benign on exam. Labs drawn in the MSE process were grossly reassuring without leukocytosis or anemia.  No significant electrolyte derangements or renal insufficiency.  No evidence of bili obstruction or pancreatitis.  UA with bacteriuria but nitrites and leukocytes are negative.  Will send for culture but unlikely symptomatic urinary tract infection.  Noncon CT scan obtained due to contrast shortage which did not review any evidence of significant intra-abdominal inflammatory/infectious process.  Given the duration of tick attachment, unlikely that she has a tickborne illness.  Labs for tickborne illness were drawn during the MSE process and will results in 1 to 2 days.  Recommend she follow-up with her primary care provider for the results of the labs.      Final Clinical Impression(s) / ED Diagnoses Final diagnoses:  Diarrhea of presumed infectious origin  Abdominal cramping  Tick bite of right side of chest wall, initial encounter   The patient appears reasonably screened and/or stabilized for discharge and I doubt any other medical condition or other [facility] requiring further screening, evaluation, or treatment in the ED at this time prior to discharge. Safe for discharge with strict return precautions.  Disposition: Discharge  Condition: Good  I have discussed the results, Dx and Tx plan with the patient/family who expressed understanding and agree(s) with the plan. Discharge instructions discussed at length. The patient/family was given strict return precautions who verbalized understanding of the instructions. No further questions at time of discharge.     ED Discharge Orders    None       Follow Up: Steele Sizer, [address] [place] [address] [phone]  Call  to schedule an appointment for close follow up      This chart was dictated using voice recognition software.  Despite best efforts to proofread,  errors can occur which can change the documentation meaning.   Fatima Blank, MD 04/29/21 [phone]

## 2021-04-28 NOTE — ED Triage Notes (Signed)
Pt complains of abdominal pain and diarrhea.  X2 weeks.  Pain generalized in nature however worse to right upper quadrant right lower quadrant.

## 2021-04-28 NOTE — ED Provider Notes (Signed)
Emergency Medicine Provider Triage Evaluation Note  Ashley Patel , a 40 y.o. female  was evaluated in triage.  Pt complains of abdominal pain and diarrhea.  X2 weeks.  Pain generalized in nature however worse to right upper quadrant right lower quadrant.  States she has had "multiple watery bowel movements "daily.  She is unable to quantify.  She denies any bright red blood or melanotic stools.  No recent antibiotics or travel.  No sick contacts.  No fever or emesis.  Feels persistently nauseous. Bite by tick 2- 3 weeks ago.  Review of Systems  Positive: Abdominal pain, diarrhea, nausea Negative: Fever, chills, rash  Physical Exam  BP 132/80 (BP Location: Left Arm)   Pulse 95   Temp 98.3 F (36.8 C) (Oral)   Resp 18   SpO2 99%  Gen:   Awake, no distress   Resp:  Normal effort  MSK:   Moves extremities without difficulty  ABD:  Some tenderness however worse to right upper quadrant right lower quadrant however no Murphy sign Other:  No rash  Medical Decision Making  Medically screening exam initiated at 2:21 PM.  Appropriate orders placed.  Ashley Patel was informed that the remainder of the evaluation will be completed by another provider, this initial triage assessment does not replace that evaluation, and the importance of remaining in the ED until their evaluation is complete.  Nausea, abdominal pain, diarrhea, recent tick bite 1 week prior to symptoms starting.  No associated rash.  Plan on labs, tick titers   Jermisha Hoffart A, PA-C 04/28/21 [place], Ankit, MD 05/01/21 1546

## 2021-04-30 LAB — URINE CULTURE: Culture: <10000 — AB

## 2021-05-03 LAB — B. BURGDORFI ANTIBODIES

## 2021-05-03 LAB — ROCKY MTN SPOTTED FVR ABS PNL(IGG+IGM)
RMSF IgG: NEGATIVE
RMSF IgM: 0.63 index (ref 0.00–0.89)

## 2021-05-03 LAB — MISC LABCORP TEST (SEND OUT): Labcorp test code: 164226

## 2021-05-11 ENCOUNTER — Other Ambulatory Visit: Payer: Self-pay

## 2021-06-27 NOTE — Progress Notes (Signed)
Name: Ashley Patel   MRN: [id number]    DOB: 18-Feb-1981   Date:06/28/2021       Progress Note  Subjective  Chief Complaint  Follow Up  HPI  Mood disorder  : she was admitted with suicidal thoughts back in 2019 and diagnosed with bipolar disorder, she took lithium and felt like it did not help with symptoms. She quit her job January 2020 and mood improved quickly after she quit it. She states that before she quit her job she had some auditory hallucinations and one episode of visual hallucinations but all symptoms resolved since. She states shifts abruptly through her cycles. She develops  irritability , feels very angry and has episodes of rage episodes that is present two weeks prior to cycles but much worse a few days before and she needs to walk away and go for a ride to avoid blowing up in front of the kids. She states two days prior to her cycles she has suicidal thoughts but she knows that it resolves once her cycles re-starts  . She has used Mirena in the past but she did not like it because she felt off and used to have UTI's. We gave seasonique back in March 2021, She went to GYN but sent back to psychiatrist. She states she is doing much better with Dr. Waylan Boga management. She is on Lamictal, Citalopram, trazodone and Buspar She denies side effects of medications.She has not been able to afford therapist at this time, she was able wean self off marijuana since medications are working.   B12 deficiency/Vitamin D deficiency:  She is taking supplementation, we will recheck levels.   Asthma Mild intermittent: she denies wheezing, cough or sob, she has albuterol at home, but has not used it   GERD: she has lost weight doing intermittent fasting and is doing well now, no longer having symptoms. She states episodes about once or twice a month   Dysuria: she has noticed burning and increase in urinary frequency  int he past few days. No fever , chills or back pain   Patient Active Problem List    Diagnosis Date Noted   Mood disorder ([place]) 12/28/2020   Insomnia due to other mental disorder 12/28/2020   Premenstrual dysphoria 03/25/2020   Acid reflux 03/14/2018   Panic attack 08/11/2015   B12 deficiency 08/05/2015   Acne 08/05/2015   Blood glucose elevated 08/05/2015   IBS (irritable bowel syndrome) 08/05/2015   Numerous moles 08/05/2015   Vitamin D deficiency 08/05/2015   Family history of thyroid disease 08/05/2015   Asthma without status asthmaticus 06/16/2010    Past Surgical History:  Procedure Laterality Date   CHOLECYSTECTOMY     TONSILLECTOMY AND ADENOIDECTOMY      Family History  Problem Relation Age of Onset   Hypertension Mother    Other Father        post-polio syndrome   Sudden Cardiac Death Neg Hx     Social History   Tobacco Use   Smoking status: Former    Packs/day: 0.50    Years: 5.00    Pack years: 2.50    Types: Cigarettes    Start date: 12/04/2003    Quit date: 11/04/2009    Years since quitting: 11.6   Smokeless tobacco: Never  Substance Use Topics   Alcohol use: Yes    Alcohol/week: 0.0 standard drinks    Comment: Drink 2x per year     Current Outpatient Medications:    albuterol (VENTOLIN  HFA) 108 (90 Base) MCG/ACT inhaler, INHALE 2 PUFFS INTO THE LUNGS EVERY 6 (SIX) HOURS AS NEEDED FOR WHEEZING OR SHORTNESS OF BREATH, Disp: 8.5 g, Rfl: 0   Cholecalciferol 25 MCG (1000 UT) tablet, Take by mouth., Disp: , Rfl:    citalopram (CELEXA) 20 MG tablet, Take 1 tablet daily with breakfast, Disp: 90 tablet, Rfl: 0   clonazePAM (KLONOPIN) 0.5 MG tablet, TAKE 1 TABLET BY MOUTH ONCE DAILY DURING WEEK PRIOR TO MENSES, Disp: 5 tablet, Rfl: 0   Cyanocobalamin (VITAMIN B-12) 1000 MCG SUBL, Place 1 tablet under the tongue daily., Disp: , Rfl:    lamoTRIgine (LAMICTAL) 200 MG tablet, Take 1 tablet by mouth daily, Disp: 90 tablet, Rfl: 0   lamoTRIgine (LAMICTAL) 25 MG tablet, Take 2 by mouth at bedtime, Disp: 180 tablet, Rfl: 0   MELATONIN SL, Place  under the tongue., Disp: , Rfl:   Allergies  Allergen Reactions   Amoxil [Amoxicillin]     Severe yeast     I personally reviewed active problem list, medication list, allergies, family history, social history, health maintenance with the patient/caregiver today.   ROS  Constitutional: Negative for fever , positive for weight change.  Respiratory: Negative for cough and shortness of breath.   Cardiovascular: Negative for chest pain or palpitations.  Gastrointestinal: Negative for abdominal pain, no bowel changes.  Musculoskeletal: Negative for gait problem or joint swelling.  Skin: Negative for rash.  Neurological: Negative for dizziness or headache.  No other specific complaints in a complete review of systems (except as listed in HPI above).   Objective  Vitals:   06/28/21 0833  BP: 112/72  Pulse: 77  Resp: 16  Temp: 98.2 F (36.8 C)  SpO2: 98%  Weight: 154 lb (69.9 kg)  Height: '5\' 9"'$  (1.753 m)    Body mass index is 22.74 kg/m.  Physical Exam  Constitutional: Patient appears well-developed and well-nourished.No distress.  HEENT: head atraumatic, normocephalic, pupils equal and reactive to light, neck supple Cardiovascular: Normal rate, regular rhythm and normal heart sounds.  No murmur heard. No BLE edema. Pulmonary/Chest: Effort normal and breath sounds normal. No respiratory distress. Abdominal: Soft.  There is no tenderness. Psychiatric: Patient has a normal mood and affect. behavior is normal. Judgment and thought content normal.   Recent Results (from the past 2160 hour(s))  Rocky mtn spotted fvr abs pnl(IgG+IgM)     Status: None   Collection Time: 04/28/21  4:51 AM  Result Value Ref Range   RMSF IgG Negative Negative   RMSF IgM 0.63 0.00 - 0.89 index    Comment: (NOTE)                                 Negative        <0.90                                 Equivocal 0.90 - 1.10                                 Positive        >1.10 Performed At: [facility]  [facility] [address] [place], [place] [id number] Rush Farmer MD [id number]   CBC with Differential     Status: None   Collection Time: 04/28/21  2:21 PM  Result Value Ref Range   WBC 6.9 4.0 - 10.5 K/uL   RBC 4.88 3.87 - 5.11 MIL/uL   Hemoglobin 14.0 12.0 - 15.0 g/dL   HCT 44.2 36.0 - 46.0 %   MCV 90.6 80.0 - 100.0 fL   MCH 28.7 26.0 - 34.0 pg   MCHC 31.7 30.0 - 36.0 g/dL   RDW 11.9 11.5 - 15.5 %   Platelets 300 150 - 400 K/uL   nRBC 0.0 0.0 - 0.2 %   Neutrophils Relative % 45 %   Neutro Abs 3.1 1.7 - 7.7 K/uL   Lymphocytes Relative 37 %   Lymphs Abs 2.6 0.7 - 4.0 K/uL   Monocytes Relative 12 %   Monocytes Absolute 0.8 0.1 - 1.0 K/uL   Eosinophils Relative 4 %   Eosinophils Absolute 0.3 0.0 - 0.5 K/uL   Basophils Relative 1 %   Basophils Absolute 0.1 0.0 - 0.1 K/uL   Immature Granulocytes 1 %   Abs Immature Granulocytes 0.07 0.00 - 0.07 K/uL    Comment: Performed at [facility], [address], [place], [place] [address]  Comprehensive metabolic panel     Status: None   Collection Time: 04/28/21  2:21 PM  Result Value Ref Range   Sodium 139 135 - 145 mmol/L   Potassium 3.7 3.5 - 5.1 mmol/L   Chloride 106 98 - 111 mmol/L   CO2 27 22 - 32 mmol/L   Glucose, Bld 87 70 - 99 mg/dL    Comment: Glucose reference range applies only to samples taken after fasting for at least 8 hours.   BUN 13 6 - 20 mg/dL   Creatinine, Ser 0.95 0.44 - 1.00 mg/dL   Calcium 9.6 8.9 - 10.3 mg/dL   Total Protein 6.9 6.5 - 8.1 g/dL   Albumin 4.0 3.5 - 5.0 g/dL   AST 21 15 - 41 U/L   ALT 16 0 - 44 U/L   Alkaline Phosphatase 76 38 - 126 U/L   Total Bilirubin 0.9 0.3 - 1.2 mg/dL   GFR, Estimated >60 >60 mL/min    Comment: (NOTE) Calculated using the CKD-EPI Creatinine Equation (2021)    Anion gap 6 5 - 15    Comment: Performed at [facility] [address] [place], [place] [address]  Lipase, blood     Status: None   Collection Time: 04/28/21  2:21 PM  Result  Value Ref Range   Lipase 34 11 - 51 U/L    Comment: Performed at [facility] [address], [place],  [address]  Urinalysis, Routine w reflex microscopic Urine, Clean Catch     Status: Abnormal   Collection Time: 04/28/21  2:21 PM  Result Value Ref Range   Color, Urine YELLOW YELLOW   APPearance TURBID (A) CLEAR   Specific Gravity, Urine 1.027 1.005 - 1.030   pH 5.0 5.0 - 8.0   Glucose, UA NEGATIVE NEGATIVE mg/dL   Hgb urine dipstick NEGATIVE NEGATIVE   Bilirubin Urine NEGATIVE NEGATIVE   Ketones, ur 5 (A) NEGATIVE mg/dL   Protein, ur NEGATIVE NEGATIVE mg/dL   Nitrite NEGATIVE NEGATIVE   Leukocytes,Ua NEGATIVE NEGATIVE   RBC / HPF 0-5 0 - 5 RBC/hpf   Bacteria, UA MANY (A) NONE SEEN   Squamous Epithelial / LPF 0-5 0 - 5   Mucus PRESENT    Amorphous Crystal PRESENT     Comment: Performed at [facility], [place],  [place] [address]  B. burgdorfi antibodies     Status: None   Collection Time: 04/28/21  2:21 PM  Result Value Ref Range   B burgdorferi Ab IgG+IgM see [place] Report.  Test discontinued by [facility]     Comment: Performed at [facility], [place] [address] [place], [place] [address]  Urine culture     Status: Abnormal   Collection Time: 04/28/21  2:21 PM   Specimen: Urine, Random  Result Value Ref Range   Specimen Description URINE, RANDOM    Special Requests NONE    Culture (A)     <10,000 COLONIES/mL INSIGNIFICANT GROWTH Performed at [facility], [place] [address], [place], [place] [address]    Report Status 04/30/2021 FINAL   Miscellaneous [facility] test (send-out)     Status: None   Collection Time: 04/28/21  2:21 PM  Result Value Ref Range   [facility] test code [id number]    [facility] test name Lyme Disease Serology w Reflex    Misc [facility] result See Scanned report in [place]: Performed at [facility], [place] [address] [place], [place] [address]  I-Stat Beta hCG blood, ED ([facility], [facility], [facility] only)      Status: None   Collection Time: 04/28/21  2:40 PM  Result Value Ref Range   I-stat hCG, quantitative <5.0 <5 mIU/mL   Comment 3            Comment:   GEST. AGE      CONC.  (mIU/mL)   <=1 WEEK        5 - 50     2 WEEKS       50 - 500     3 WEEKS       100 - 10,000     4 WEEKS     1,000 - 30,000        FEMALE AND NON-PREGNANT FEMALE:     LESS THAN 5 mIU/mL      PHQ2/9: Depression screen [facility] 2/9 06/28/2021 04/24/2021 12/28/2020 09/26/2020 03/25/2020  Decreased Interest '2 2 2 3 '$ 0  Down, Depressed, Hopeless '1 1 2 3 2  '$ PHQ - 2 Score '3 3 4 6 2  '$ Altered sleeping '3 3 3 3 2  '$ Tired, decreased energy 0 '2 1 3 2  '$ Change in appetite 0 0 2 0 2  Feeling bad or failure about yourself  0 0 0 0 0  Trouble concentrating 0 '2 3 3 3  '$ Moving slowly or fidgety/restless 0 0 0 0 0  Suicidal thoughts 0 0 0 0 0  PHQ-9 Score '6 10 13 15 11  '$ Difficult doing work/chores - - - Extremely dIfficult Somewhat difficult  Some recent data might be hidden    phq 9 is positive   Fall Risk: Fall Risk  06/28/2021 04/24/2021 12/28/2020 09/26/2020 03/25/2020  Falls in the past year? 0 0 0 0 0  Number falls in past yr: 0 0 0 0 0  Injury with Fall? 0 0 0 0 0    Functional Status Survey: Is the patient deaf or have difficulty hearing?: No Does the patient have difficulty seeing, even when wearing glasses/contacts?: No Does the patient have difficulty concentrating, remembering, or making decisions?: No Does the patient have difficulty walking or climbing stairs?: No Does the patient have difficulty dressing or bathing?: No Does the patient have difficulty doing errands alone such as visiting a doctor's office or shopping?: No  Assessment & Plan  1. Asthma, mild intermittent, well-controlled  Doing well   2. Mood disorder ([place])  Keep follow up with Dr. Nicolasa Ducking   3. PMDD (premenstrual dysphoric disorder)   4. Insomnia due to other mental disorder   5. Vitamin B12 deficiency  - CBC with  Differential/Platelet - Vitamin B12  6. Abnormal TSH  - TSH  7. Vitamin D deficiency  - VITAMIN D 25 Hydroxy (Vit-D Deficiency, Fractures)  8. Lipid screening  - Lipid panel  9. Long-term use of high-risk medication  - COMPLETE METABOLIC PANEL WITH GFR  10. Dysuria  - CULTURE, URINE COMPREHENSIVE - ciprofloxacin (CIPRO) 250 MG tablet; Take 1 tablet (250 mg total) by mouth 2 (two) times daily for 3 days.  Dispense: 6 tablet; Refill: 0

## 2021-06-28 ENCOUNTER — Encounter: Payer: Self-pay | Admitting: Family Medicine

## 2021-06-28 ENCOUNTER — Other Ambulatory Visit: Payer: Self-pay

## 2021-06-28 ENCOUNTER — Ambulatory Visit (INDEPENDENT_AMBULATORY_CARE_PROVIDER_SITE_OTHER): Payer: BC Managed Care – PPO | Admitting: Family Medicine

## 2021-06-28 VITALS — BP 112/72 | HR 77 | Temp 98.2°F | Resp 16 | Ht 69.0 in | Wt 154.0 lb

## 2021-06-28 DIAGNOSIS — J4520 Mild intermittent asthma, uncomplicated: Secondary | ICD-10-CM | POA: Diagnosis not present

## 2021-06-28 DIAGNOSIS — F39 Unspecified mood [affective] disorder: Secondary | ICD-10-CM | POA: Diagnosis not present

## 2021-06-28 DIAGNOSIS — R7989 Other specified abnormal findings of blood chemistry: Secondary | ICD-10-CM

## 2021-06-28 DIAGNOSIS — Z13220 Encounter for screening for lipoid disorders: Secondary | ICD-10-CM

## 2021-06-28 DIAGNOSIS — R300 Dysuria: Secondary | ICD-10-CM

## 2021-06-28 DIAGNOSIS — F3281 Premenstrual dysphoric disorder: Secondary | ICD-10-CM

## 2021-06-28 DIAGNOSIS — Z79899 Other long term (current) drug therapy: Secondary | ICD-10-CM | POA: Diagnosis not present

## 2021-06-28 DIAGNOSIS — F5105 Insomnia due to other mental disorder: Secondary | ICD-10-CM

## 2021-06-28 DIAGNOSIS — E538 Deficiency of other specified B group vitamins: Secondary | ICD-10-CM

## 2021-06-28 DIAGNOSIS — F99 Mental disorder, not otherwise specified: Secondary | ICD-10-CM

## 2021-06-28 DIAGNOSIS — E559 Vitamin D deficiency, unspecified: Secondary | ICD-10-CM

## 2021-06-28 MED ORDER — CIPROFLOXACIN HCL 250 MG PO TABS
250.0000 mg | ORAL_TABLET | Freq: Two times a day (BID) | ORAL | 0 refills | Status: AC
  Filled 2021-06-28: qty 6, 3d supply, fill #0

## 2021-06-30 LAB — LIPID PANEL
Cholesterol: 161 mg/dL (ref <200)
HDL: 54 mg/dL (ref >=50)
LDL Cholesterol (Calc): 93 mg/dL (calc)
Non-HDL Cholesterol (Calc): 107 mg/dL (calc) (ref <130)
Total CHOL/HDL Ratio: 3 (calc) (ref <5.0)
Triglycerides: 48 mg/dL (ref <150)

## 2021-06-30 LAB — CBC WITH DIFFERENTIAL/PLATELET
Absolute Monocytes: 508 cells/uL (ref 200–950)
Basophils Absolute: 38 cells/uL (ref 0–200)
Basophils Relative: 0.7 %
Eosinophils Absolute: 167 cells/uL (ref 15–500)
Eosinophils Relative: 3.1 %
HCT: 40.8 % (ref 35.0–45.0)
Hemoglobin: 13.4 g/dL (ref 11.7–15.5)
Lymphs Abs: 2128 cells/uL (ref 850–3900)
MCH: 29.1 pg (ref 27.0–33.0)
MCHC: 32.8 g/dL (ref 32.0–36.0)
MCV: 88.7 fL (ref 80.0–100.0)
MPV: 10.3 fL (ref 7.5–12.5)
Monocytes Relative: 9.4 %
Neutro Abs: 2560 cells/uL (ref 1500–7800)
Neutrophils Relative %: 47.4 %
Platelets: 278 10*3/uL (ref 140–400)
RBC: 4.6 10*6/uL (ref 3.80–5.10)
RDW: 12.1 % (ref 11.0–15.0)
Total Lymphocyte: 39.4 %
WBC: 5.4 10*3/uL (ref 3.8–10.8)

## 2021-06-30 LAB — CULTURE, URINE COMPREHENSIVE
MICRO NUMBER:: 12171643
SPECIMEN QUALITY:: ADEQUATE

## 2021-06-30 LAB — COMPLETE METABOLIC PANEL WITH GFR
AG Ratio: 1.8 (calc) (ref 1.0–2.5)
ALT: 9 U/L (ref 6–29)
AST: 12 U/L (ref 10–30)
Albumin: 4.3 g/dL (ref 3.6–5.1)
Alkaline phosphatase (APISO): 66 U/L (ref 31–125)
BUN: 12 mg/dL (ref 7–25)
CO2: 29 mmol/L (ref 20–32)
Calcium: 9.7 mg/dL (ref 8.6–10.2)
Chloride: 105 mmol/L (ref 98–110)
Creat: 0.85 mg/dL (ref 0.50–0.97)
Globulin: 2.4 g/dL (calc) (ref 1.9–3.7)
Glucose, Bld: 88 mg/dL (ref 65–99)
Potassium: 4.4 mmol/L (ref 3.5–5.3)
Sodium: 139 mmol/L (ref 135–146)
Total Bilirubin: 0.5 mg/dL (ref 0.2–1.2)
Total Protein: 6.7 g/dL (ref 6.1–8.1)
eGFR: 90 mL/min/{1.73_m2} (ref >=60)

## 2021-06-30 LAB — TSH: TSH: 2.36 mIU/L

## 2021-06-30 LAB — VITAMIN B12: Vitamin B-12: 292 pg/mL (ref 200–1100)

## 2021-06-30 LAB — VITAMIN D 25 HYDROXY (VIT D DEFICIENCY, FRACTURES): Vit D, 25-Hydroxy: 38 ng/mL (ref 30–100)

## 2021-08-28 ENCOUNTER — Other Ambulatory Visit: Payer: Self-pay

## 2021-08-28 DIAGNOSIS — F39 Unspecified mood [affective] disorder: Secondary | ICD-10-CM | POA: Diagnosis not present

## 2021-08-28 DIAGNOSIS — F411 Generalized anxiety disorder: Secondary | ICD-10-CM | POA: Diagnosis not present

## 2021-08-28 DIAGNOSIS — F5105 Insomnia due to other mental disorder: Secondary | ICD-10-CM | POA: Diagnosis not present

## 2021-08-28 DIAGNOSIS — F1210 Cannabis abuse, uncomplicated: Secondary | ICD-10-CM | POA: Diagnosis not present

## 2021-08-28 MED ORDER — CITALOPRAM HYDROBROMIDE 20 MG PO TABS
ORAL_TABLET | ORAL | 0 refills | Status: DC
  Filled 2021-08-28: qty 30, 30d supply, fill #0

## 2021-08-28 MED ORDER — LAMOTRIGINE 200 MG PO TABS
ORAL_TABLET | Freq: Every day | ORAL | 0 refills | Status: DC
  Filled 2021-08-28: qty 30, 30d supply, fill #0

## 2021-08-28 MED ORDER — LAMOTRIGINE 25 MG PO TABS
ORAL_TABLET | ORAL | 0 refills | Status: DC
  Filled 2021-08-28: qty 60, 30d supply, fill #0

## 2021-08-28 MED ORDER — TRAZODONE HCL 100 MG PO TABS
ORAL_TABLET | ORAL | 0 refills | Status: DC
  Filled 2021-08-28: qty 60, 30d supply, fill #0

## 2021-08-28 MED ORDER — BUSPIRONE HCL 15 MG PO TABS
ORAL_TABLET | ORAL | 0 refills | Status: DC
  Filled 2021-08-28: qty 60, 30d supply, fill #0

## 2021-08-29 ENCOUNTER — Other Ambulatory Visit: Payer: Self-pay

## 2021-08-31 ENCOUNTER — Other Ambulatory Visit: Payer: Self-pay

## 2021-09-01 ENCOUNTER — Other Ambulatory Visit: Payer: Self-pay

## 2021-09-01 ENCOUNTER — Encounter: Payer: Self-pay | Admitting: Family Medicine

## 2021-09-05 ENCOUNTER — Other Ambulatory Visit: Payer: Self-pay

## 2021-09-07 ENCOUNTER — Other Ambulatory Visit: Payer: Self-pay

## 2021-09-08 ENCOUNTER — Other Ambulatory Visit: Payer: Self-pay

## 2021-09-25 ENCOUNTER — Other Ambulatory Visit: Payer: Self-pay

## 2021-09-25 DIAGNOSIS — F411 Generalized anxiety disorder: Secondary | ICD-10-CM | POA: Diagnosis not present

## 2021-09-25 DIAGNOSIS — F39 Unspecified mood [affective] disorder: Secondary | ICD-10-CM | POA: Diagnosis not present

## 2021-09-25 DIAGNOSIS — F5105 Insomnia due to other mental disorder: Secondary | ICD-10-CM | POA: Diagnosis not present

## 2021-09-25 DIAGNOSIS — F1210 Cannabis abuse, uncomplicated: Secondary | ICD-10-CM | POA: Diagnosis not present

## 2021-09-25 MED ORDER — LAMOTRIGINE 200 MG PO TABS
ORAL_TABLET | Freq: Every day | ORAL | 0 refills | Status: DC
  Filled 2021-09-25 – 2021-12-05 (×2): qty 90, 90d supply, fill #0

## 2021-09-25 MED ORDER — CITALOPRAM HYDROBROMIDE 20 MG PO TABS
ORAL_TABLET | ORAL | 0 refills | Status: DC
  Filled 2021-09-25 – 2021-12-05 (×2): qty 90, 90d supply, fill #0

## 2021-09-25 MED ORDER — BUSPIRONE HCL 15 MG PO TABS
ORAL_TABLET | ORAL | 0 refills | Status: DC
  Filled 2021-09-25 – 2021-12-05 (×2): qty 180, 90d supply, fill #0

## 2021-09-25 MED ORDER — LAMOTRIGINE 25 MG PO TABS
ORAL_TABLET | ORAL | 0 refills | Status: DC
  Filled 2021-09-25 – 2021-12-05 (×2): qty 90, 45d supply, fill #0

## 2021-09-25 MED ORDER — TRAZODONE HCL 100 MG PO TABS
ORAL_TABLET | ORAL | 0 refills | Status: DC
  Filled 2021-09-25 – 2021-12-05 (×2): qty 180, 90d supply, fill #0

## 2021-09-26 ENCOUNTER — Encounter: Payer: Self-pay | Admitting: Family Medicine

## 2021-10-09 ENCOUNTER — Other Ambulatory Visit: Payer: Self-pay

## 2021-10-16 ENCOUNTER — Other Ambulatory Visit: Payer: Self-pay

## 2021-12-05 ENCOUNTER — Other Ambulatory Visit: Payer: Self-pay

## 2021-12-27 ENCOUNTER — Other Ambulatory Visit: Payer: Self-pay

## 2021-12-27 DIAGNOSIS — F39 Unspecified mood [affective] disorder: Secondary | ICD-10-CM | POA: Diagnosis not present

## 2021-12-27 DIAGNOSIS — F5105 Insomnia due to other mental disorder: Secondary | ICD-10-CM | POA: Diagnosis not present

## 2021-12-27 DIAGNOSIS — F1210 Cannabis abuse, uncomplicated: Secondary | ICD-10-CM | POA: Diagnosis not present

## 2021-12-27 DIAGNOSIS — F411 Generalized anxiety disorder: Secondary | ICD-10-CM | POA: Diagnosis not present

## 2021-12-27 MED ORDER — BUSPIRONE HCL 15 MG PO TABS
ORAL_TABLET | ORAL | 0 refills | Status: DC
  Filled 2021-12-27: qty 180, 90d supply, fill #0

## 2021-12-27 MED ORDER — LAMOTRIGINE 200 MG PO TABS
ORAL_TABLET | Freq: Every day | ORAL | 0 refills | Status: DC
  Filled 2021-12-27: qty 90, 90d supply, fill #0

## 2021-12-27 MED ORDER — CLONAZEPAM 0.5 MG PO TABS
ORAL_TABLET | ORAL | 2 refills | Status: DC
  Filled 2021-12-27: qty 5, 30d supply, fill #0

## 2021-12-27 MED ORDER — CITALOPRAM HYDROBROMIDE 20 MG PO TABS
ORAL_TABLET | ORAL | 0 refills | Status: DC
  Filled 2021-12-27: qty 90, 90d supply, fill #0

## 2021-12-27 MED ORDER — LAMOTRIGINE 25 MG PO TABS
ORAL_TABLET | ORAL | 0 refills | Status: DC
  Filled 2021-12-27 – 2022-08-14 (×2): qty 90, 45d supply, fill #0

## 2021-12-27 MED ORDER — TRAZODONE HCL 100 MG PO TABS
ORAL_TABLET | ORAL | 0 refills | Status: DC
  Filled 2021-12-27: qty 180, 90d supply, fill #0

## 2022-01-01 ENCOUNTER — Encounter: Payer: Self-pay | Admitting: Family Medicine

## 2022-01-09 ENCOUNTER — Other Ambulatory Visit: Payer: Self-pay

## 2022-03-22 ENCOUNTER — Other Ambulatory Visit: Payer: Self-pay

## 2022-03-22 ENCOUNTER — Ambulatory Visit (INDEPENDENT_AMBULATORY_CARE_PROVIDER_SITE_OTHER): Payer: BC Managed Care – PPO | Admitting: Family Medicine

## 2022-03-22 ENCOUNTER — Other Ambulatory Visit: Payer: Self-pay | Admitting: Family Medicine

## 2022-03-22 ENCOUNTER — Encounter: Payer: Self-pay | Admitting: Family Medicine

## 2022-03-22 ENCOUNTER — Ambulatory Visit
Admission: RE | Admit: 2022-03-22 | Discharge: 2022-03-22 | Disposition: A | Discharge status: Home / Self Care | Payer: BC Managed Care – PPO | Source: Ambulatory Visit | Attending: Family Medicine | Admitting: Family Medicine

## 2022-03-22 VITALS — BP 120/76 | HR 92 | Resp 16 | Ht 69.0 in | Wt 138.0 lb

## 2022-03-22 DIAGNOSIS — K6389 Other specified diseases of intestine: Secondary | ICD-10-CM | POA: Diagnosis not present

## 2022-03-22 DIAGNOSIS — R935 Abnormal findings on diagnostic imaging of other abdominal regions, including retroperitoneum: Secondary | ICD-10-CM

## 2022-03-22 DIAGNOSIS — R110 Nausea: Secondary | ICD-10-CM

## 2022-03-22 DIAGNOSIS — K529 Noninfective gastroenteritis and colitis, unspecified: Secondary | ICD-10-CM

## 2022-03-22 DIAGNOSIS — R1031 Right lower quadrant pain: Secondary | ICD-10-CM

## 2022-03-22 DIAGNOSIS — Z9049 Acquired absence of other specified parts of digestive tract: Secondary | ICD-10-CM | POA: Diagnosis not present

## 2022-03-22 DIAGNOSIS — R634 Abnormal weight loss: Secondary | ICD-10-CM

## 2022-03-22 IMAGING — CT CT ABD-PELV W/ CM
2 of 4 series · 15 of 46 positions shown, 17 images · IV contrast (agent unspecified)
Comparison: [DATE]

CLINICAL DATA: Concern for appendicitis.

EXAM:
CT ABDOMEN AND PELVIS WITH CONTRAST
TECHNIQUE: Multidetector CT imaging of the abdomen and pelvis was performed
using the standard protocol following bolus administration of
intravenous contrast.

[Series 2: abd pelvis 5.00 · axial · 0.71mm/px · z∈[-1531,-1126]mm · 12 of 89 slices shown, 14 images]
[im 4/89  soft-tissue]
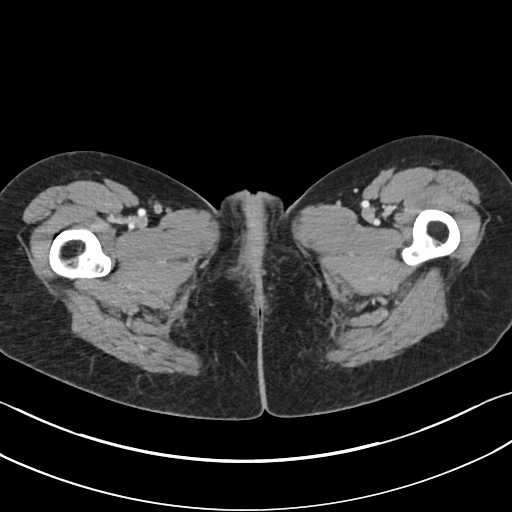
[im 4/89  bone]
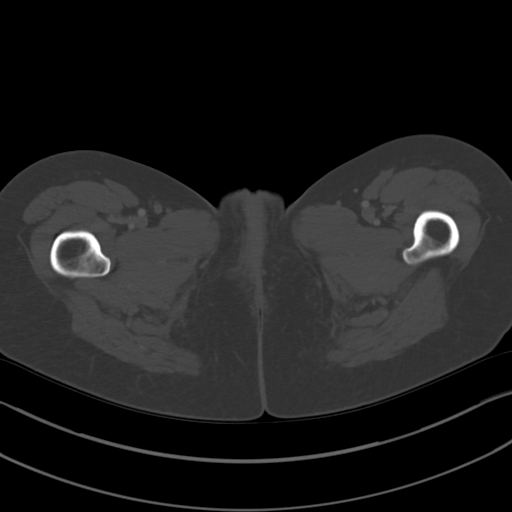
[im 12/89  soft-tissue]
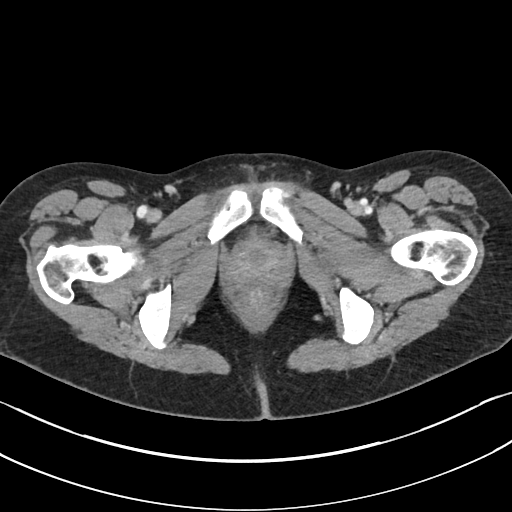
[im 19/89  soft-tissue]
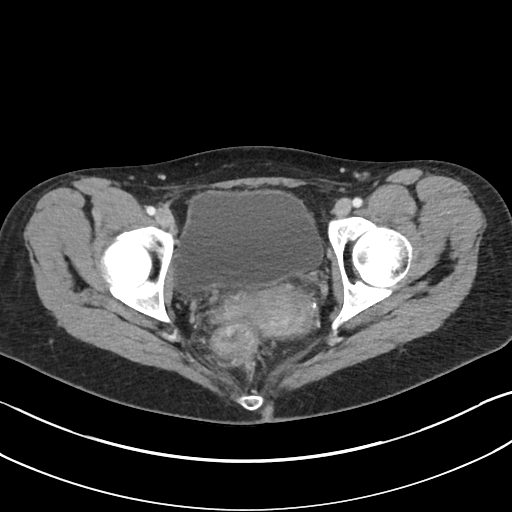
[im 26/89  soft-tissue]
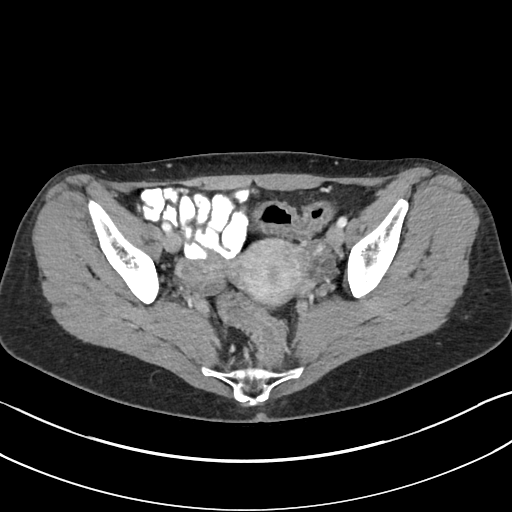
[im 34/89  soft-tissue]
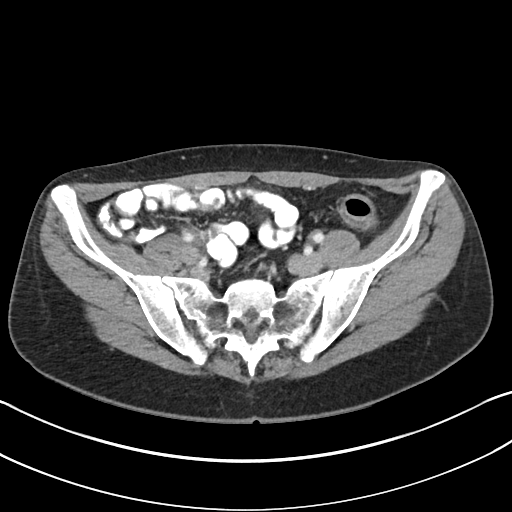
[im 41/89  soft-tissue]
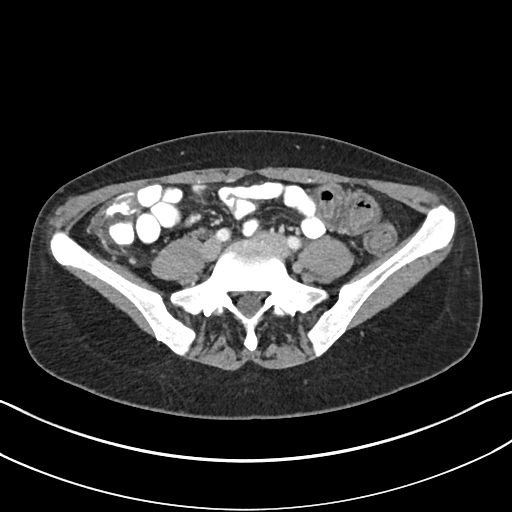
[im 48/89  soft-tissue]
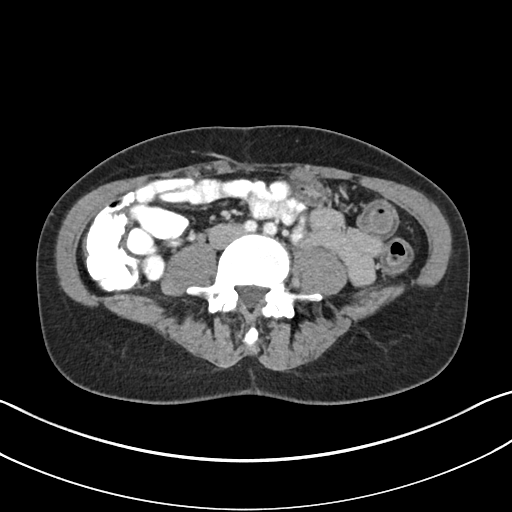
[im 56/89  soft-tissue]
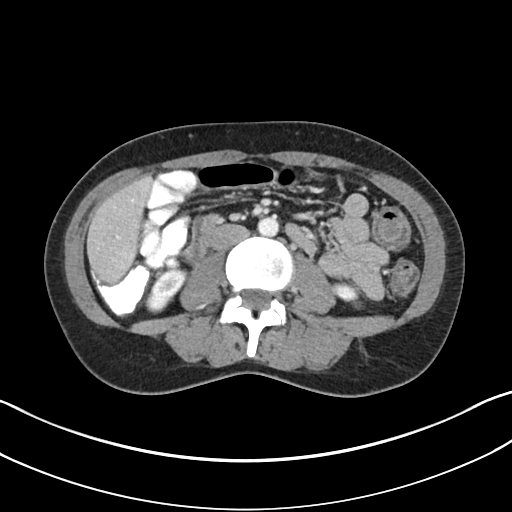
[im 63/89  soft-tissue]
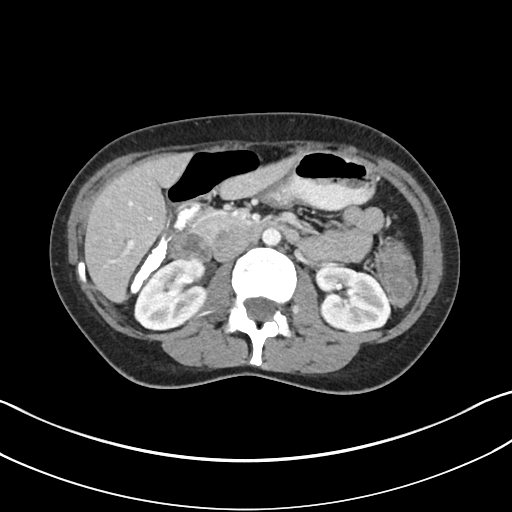
[im 63/89  bone]
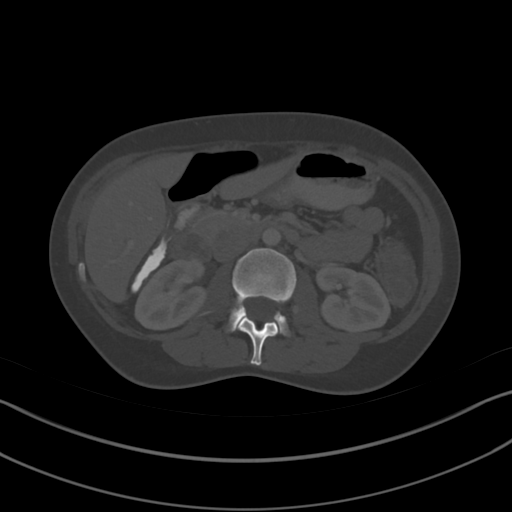
[im 70/89  soft-tissue]
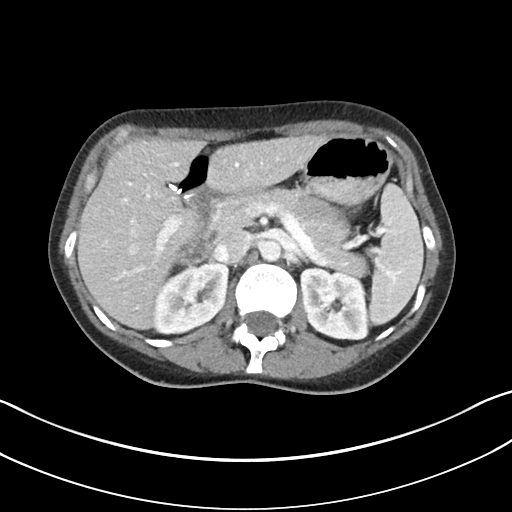
[im 78/89  soft-tissue]
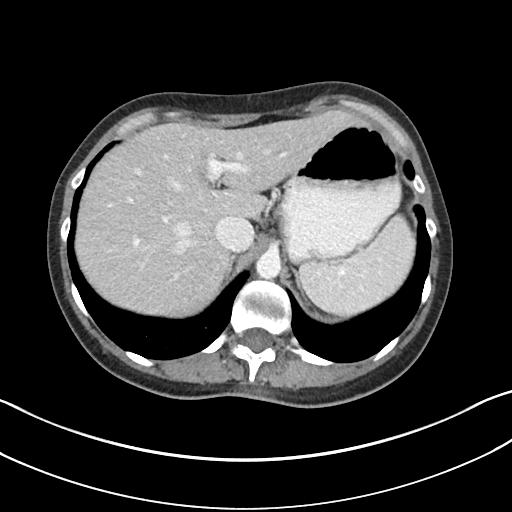
[im 85/89  soft-tissue]
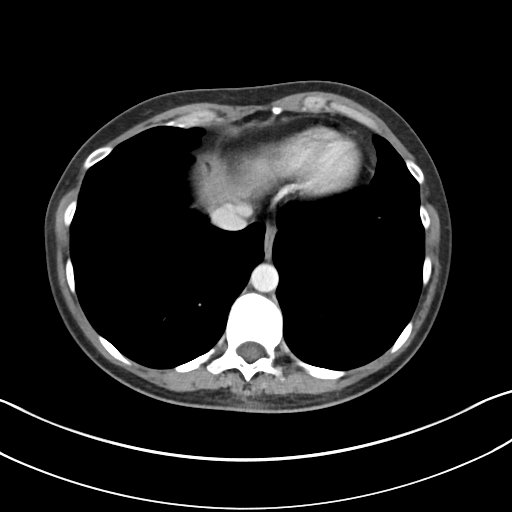

[Series 4: coronals abd pelvis 2.00 cor · coronal · 0.71mm/px · 3 of 124 slices shown]
[im 42/124  soft-tissue]
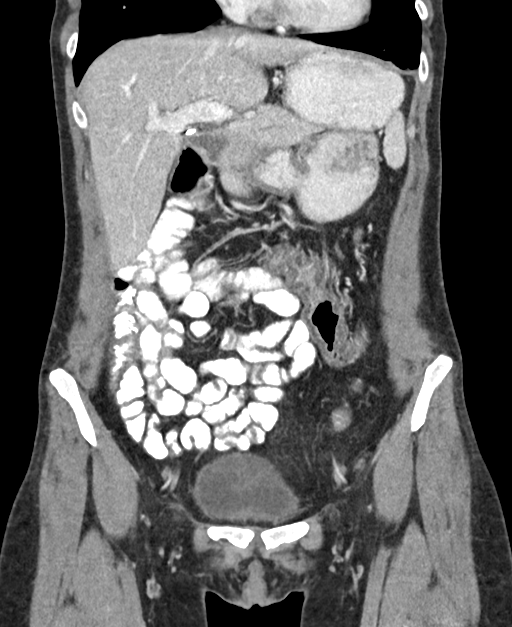
[im 55/124  soft-tissue]
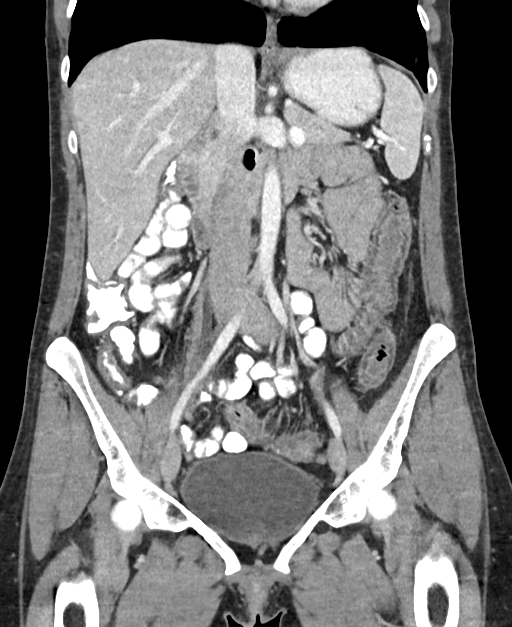
[im 69/124  soft-tissue]
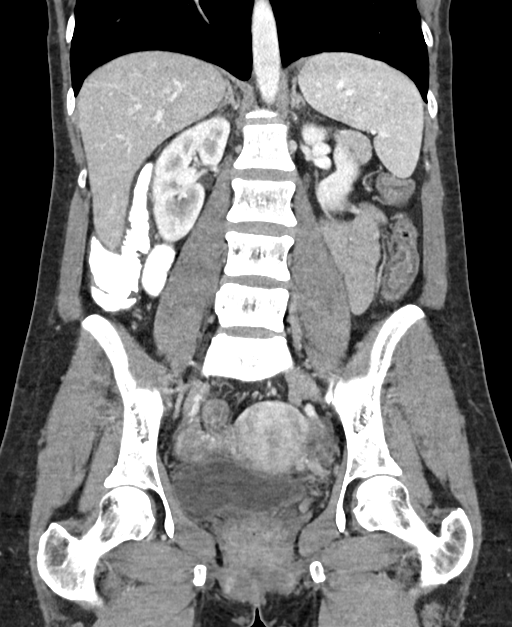

[15 of 46 positions shown; findings below may reference images not displayed]

RADIATION DOSE REDUCTION: This exam was performed according to the
departmental dose-optimization program which includes automated
exposure control, adjustment of the mA and/or kV according to
patient size and/or use of iterative reconstruction technique.

CONTRAST:  100mL OMNIPAQUE IOHEXOL 300 MG/ML  SOLN
FINDINGS: Lower chest: Limited visualization of the lower thorax is negative
for focal airspace opacity or pleural effusion. Note is made of an
approximately 1.1 cm cyst within the imaged left lower lung (image
2, series 3). No pleural effusion.

Normal heart size.  No pericardial effusion.

Hepatobiliary: Normal hepatic contour. No discrete hepatic lesions.
Post cholecystectomy. No intra extrahepatic bili duct dilatation. No
ascites.

Pancreas: Normal appearance of the pancreas.

Spleen: Normal appearance of the spleen.

Adrenals/Urinary Tract: There is symmetric enhancement of the
bilateral kidneys. No evidence of nephrolithiasis on this
postcontrast examination. No discrete renal lesions. No urinary
obstruction or perinephric stranding.

Normal appearance the bilateral adrenal glands.

Normal appearance of the urinary bladder given degree of distention.

Stomach/Bowel: Ingested enteric contrast extends to the level of the
hepatic flexure of the colon. There is diffuse colonic wall
thickening extending from the mid aspect of the transverse colon
through the descending colon and sigmoid colon to the level of the
rectum, not resulting in enteric obstruction. No evidence of
perforation or definable/drainable fluid collection. No definitive
evidence of enteric fistula. Normal appearance of the terminal ileum
and diminutive retrocecal appendix. No hiatal hernia. No
pneumoperitoneum, pneumatosis or portal venous gas.

Vascular/Lymphatic: Normal caliber the abdominal aorta. The major
branch vessels of the abdominal aorta appear patent on this non CTA
examination.

No bulky retroperitoneal, mesenteric, pelvic or inguinal
lymphadenopathy.

Reproductive: Normal appearance of the pelvic organs. No discrete
adnexal lesions. No free fluid the pelvic cul-de-sac.

Other: Regional soft tissues appear normal.

Musculoskeletal: No acute or aggressive osseous abnormalities.
IMPRESSION: 1. Diffuse colonic wall thickening extending from the mid transverse
colon to the rectum. Findings are nonspecific though could be seen
in a infectious or inflammatory enteritis. Clinical correlation is
advised. Further evaluation with colonoscopy could be performed as
indicated.
2. No evidence of appendicitis.

## 2022-03-22 MED ORDER — IOHEXOL 300 MG/ML  SOLN
100.0000 mL | Freq: Once | INTRAMUSCULAR | Status: AC | PRN
  Administered 2022-03-22: 100 mL via INTRAVENOUS

## 2022-03-22 MED ORDER — ONDANSETRON HCL 4 MG PO TABS
4.0000 mg | ORAL_TABLET | Freq: Three times a day (TID) | ORAL | 0 refills | Status: DC | PRN
  Filled 2022-03-22: qty 20, 7d supply, fill #0

## 2022-03-22 NOTE — Progress Notes (Addendum)
Name: Ashley Patel   MRN: [id number]    DOB: October 15, 1981   Date:03/22/2022 ? ?     Progress Note ? ?Subjective ? ?Chief Complaint ? ?Side Pain- Right ? ?HPI ? ?Weight loss: her baseline weight was 180 lbs, she started to gradually lose weight in 2021 , she went to [facility]22  with diarrhea, her weight at that time was mid 160 lbs but after that visit she went on Intermittent fasting and staying stable around mid 150 lbs for a while, but dropped to 145 for a while and now down to 137 lbs at home. She states this last 10 lbs she has not tried to lose weight. She is active with her kids and eats healthy but would like to stay around 145 lbs  ? ?She has noticed intermittent right lower quadrant pain over the past few months, this morning the pain was worse than usual and she decided to come in for evaluation. She states pain is dull - started on right side of the umbilicus and now is on the right lower quadrant. She felt tired yesterday and today she has nausea, pain aggravated by walking, no fever or chills. She has been fasting but states normal appetite.  ? ? ?Patient Active Problem List  ? Diagnosis Date Noted  ? Mood disorder ([place]) 12/28/2020  ? Insomnia due to other mental disorder 12/28/2020  ? Premenstrual dysphoria 03/25/2020  ? Acid reflux 03/14/2018  ? Panic attack 08/11/2015  ? B12 deficiency 08/05/2015  ? Acne 08/05/2015  ? Blood glucose elevated 08/05/2015  ? IBS (irritable bowel syndrome) 08/05/2015  ? Numerous moles 08/05/2015  ? Vitamin D deficiency 08/05/2015  ? Family history of thyroid disease 08/05/2015  ? Asthma without status asthmaticus 06/16/2010  ? ? ?Past Surgical History:  ?Procedure Laterality Date  ? CHOLECYSTECTOMY    ? TONSILLECTOMY AND ADENOIDECTOMY    ? ? ?Family History  ?Problem Relation Age of Onset  ? Hypertension Mother   ? Other Father   ?     post-polio syndrome  ? Sudden Cardiac Death Neg Hx   ? ? ?Social History  ? ?Tobacco Use  ? Smoking status: Former  ?  Packs/day: 0.50  ?   Years: 5.00  ?  Pack years: 2.50  ?  Types: Cigarettes  ?  Start date: 12/04/2003  ?  Quit date: 11/04/2009  ?  Years since quitting: 12.3  ? Smokeless tobacco: Never  ?Substance Use Topics  ? Alcohol use: Yes  ?  Alcohol/week: 0.0 standard drinks  ?  Comment: Drink 2x per year  ? ? ? ?Current Outpatient Medications:  ?  busPIRone (BUSPAR) 15 MG tablet, Take 1 twice daily, Disp: 180 tablet, Rfl: 0 ?  Cholecalciferol 25 MCG (1000 UT) tablet, Take by mouth., Disp: , Rfl:  ?  Cyanocobalamin (VITAMIN B-12) 1000 MCG SUBL, Place 1 tablet under the tongue daily., Disp: , Rfl:  ?  lamoTRIgine (LAMICTAL) 200 MG tablet, Take 1 tablet by mouth daily, Disp: 90 tablet, Rfl: 0 ?  lamoTRIgine (LAMICTAL) 25 MG tablet, Take 2 at bedtime, Disp: 90 tablet, Rfl: 0 ?  traZODone (DESYREL) 100 MG tablet, Take 1-2 tablets po nightly prn sleep, Disp: 180 tablet, Rfl: 0 ?  albuterol (VENTOLIN HFA) 108 (90 Base) MCG/ACT inhaler, INHALE 2 PUFFS INTO THE LUNGS EVERY 6 (SIX) HOURS AS NEEDED FOR WHEEZING OR SHORTNESS OF BREATH, Disp: 8.5 g, Rfl: 0 ?  clonazePAM (KLONOPIN) 0.5 MG tablet, TAKE 1 TABLET BY  MOUTH ONCE DAILY DURING WEEK PRIOR TO MENSES, Disp: 5 tablet, Rfl: 0 ?  ondansetron (ZOFRAN) 4 MG tablet, Take 1 tablet (4 mg total) by mouth every 8 (eight) hours as needed for nausea or vomiting., Disp: 20 tablet, Rfl: 0 ? ?Allergies  ?Allergen Reactions  ? Amoxil [Amoxicillin]   ?  Severe yeast   ? ? ?I personally reviewed active problem list, medication list, allergies, family history, social history, health maintenance with the patient/caregiver today. ? ? ?ROS ? ?Constitutional: Negative for fever, positive for  weight change.  ?Respiratory: Negative for cough and shortness of breath.   ?Cardiovascular: Negative for chest pain or palpitations.  ?Gastrointestinal: positive for abdominal pain, no bowel changes.  ?Musculoskeletal: Negative for gait problem or joint swelling.  ?Skin: Negative for rash.  ?Neurological: Negative for dizziness or  headache.  ?No other specific complaints in a complete review of systems (except as listed in HPI above).  ? ?Objective ? ?Vitals:  ? 03/22/22 1102  ?BP: 120/76  ?Pulse: 92  ?Resp: 16  ?SpO2: 99%  ?Weight: 138 lb (62.6 kg)  ?Height: '5\' 9"'$  (1.753 m)  ? ? ?Body mass index is 20.38 kg/m?. ? ?Physical Exam ? ?Constitutional: Patient appears well-developed and well-nourished. No distress.  ?HEENT: head atraumatic, normocephalic, pupils equal and reactive to light,  neck supple ?Cardiovascular: Normal rate, regular rhythm and normal heart sounds.  No murmur heard. No BLE edema. ?Pulmonary/Chest: Effort normal and breath sounds normal. No respiratory distress. ?Abdominal: Soft.  There is  tenderness during palpation of right lower quadrat, no hernias, bowel sounds normal on upper quadrants  ?Psychiatric: Patient has a normal mood and affect. behavior is normal. Judgment and thought content normal.  ? ?PHQ2/9: ? ?  03/22/2022  ? 11:01 AM 06/28/2021  ?  8:32 AM 04/24/2021  ? 11:45 AM 12/28/2020  ?  8:30 AM 09/26/2020  ?  8:34 AM  ?Depression screen PHQ 2/9  ?Decreased Interest 0 '2 2 2 3  '$ ?Down, Depressed, Hopeless 0 '1 1 2 3  '$ ?PHQ - 2 Score 0 '3 3 4 6  '$ ?Altered sleeping '3 3 3 3 3  '$ ?Tired, decreased energy 1 0 '2 1 3  '$ ?Change in appetite 0 0 0 2 0  ?Feeling bad or failure about yourself  0 0 0 0 0  ?Trouble concentrating 0 0 '2 3 3  '$ ?Moving slowly or fidgety/restless 0 0 0 0 0  ?Suicidal thoughts 0 0 0 0 0  ?PHQ-9 Score '4 6 10 13 15  '$ ?Difficult doing work/chores     Extremely dIfficult  ?  ?phq 9 is negative ? ? ?Fall Risk: ? ?  03/22/2022  ? 11:01 AM 06/28/2021  ?  8:31 AM 04/24/2021  ? 11:45 AM 12/28/2020  ?  8:30 AM 09/26/2020  ?  8:34 AM  ?Fall Risk   ?Falls in the past year? 0 0 0 0 0  ?Number falls in past yr: 0 0 0 0 0  ?Injury with Fall? 0 0 0 0 0  ?Risk for fall due to : No Fall Risks      ?Follow up Falls prevention discussed      ? ? ? ? ?Functional Status Survey: ?Is the patient deaf or have difficulty hearing?: No ?Does the  patient have difficulty seeing, even when wearing glasses/contacts?: No ?Does the patient have difficulty concentrating, remembering, or making decisions?: No ?Does the patient have difficulty walking or climbing stairs?: No ?Does the patient have difficulty dressing or bathing?:  No ?Does the patient have difficulty doing errands alone such as visiting a doctor's office or shopping?: No ? ? ? ?Assessment & Plan ? ?1. Acute right lower quadrant pain ? ?Discussed importance of not eating until results are back, discussed possibly appendicitis, go to [facility] if feels worse in the mean time. We will order labs at [facility] and CT stat and refer to surgeon if appropriate  ? ?- CT Abdomen Pelvis W Contrast; Future ?- CBC with Differential/Platelet; Future ?- Comprehensive metabolic panel; Future  ?

## 2022-03-29 ENCOUNTER — Other Ambulatory Visit: Payer: Self-pay

## 2022-03-29 ENCOUNTER — Encounter: Payer: Self-pay | Admitting: Gastroenterology

## 2022-03-29 ENCOUNTER — Ambulatory Visit (INDEPENDENT_AMBULATORY_CARE_PROVIDER_SITE_OTHER): Payer: BC Managed Care – PPO | Admitting: Gastroenterology

## 2022-03-29 VITALS — BP 125/73 | HR 68 | Temp 97.8°F | Ht 69.0 in | Wt 141.0 lb

## 2022-03-29 DIAGNOSIS — R634 Abnormal weight loss: Secondary | ICD-10-CM | POA: Diagnosis not present

## 2022-03-29 DIAGNOSIS — R197 Diarrhea, unspecified: Secondary | ICD-10-CM

## 2022-03-29 MED ORDER — NA SULFATE-K SULFATE-MG SULF 17.5-3.13-1.6 GM/177ML PO SOLN
354.0000 mL | Freq: Once | ORAL | 0 refills | Status: AC
  Filled 2022-03-29 – 2022-04-18 (×2): qty 354, 1d supply, fill #0

## 2022-03-29 NOTE — Progress Notes (Signed)
?  ?Jonathon Bellows MD, MRCP(U.K) ?[address]  ?[address]  ?[place], [place] [address]  ?Main: [phone]  ?Fax: [phone] ? ? ?Gastroenterology Consultation ? ?Referring Provider:     Steele Sizer, MD ?Primary Care Physician:  Steele Sizer, MD ?Primary Gastroenterologist:  Dr. Jonathon Bellows  ?Reason for Consultation:   Colitis ?      ? HPI:   ?Ashley Patel is a 41 y.o. y/o female referred for consultation & management  by Dr. Ancil Boozer, Drue Stager, MD.   ? ?She went to her 41 office with abdominal pain the right side on 03/22/2022.  At that point of time she states she has lost weight over the past 2 years.  Subsequently she underwent a CT scan of the abdomen pelvis with contrast on 03/22/2022 that showed diffuse colonic wall thickening extending from the mid transverse colon to the rectum/suspicious for infectious or inflammatory enteritis.  Colonoscopy was recommended.  A similar CT was performed in May 2022 and no acute findings were noted. ? ?Labs were ordered but not performed.   ? ?She states that over the past few months she has lost weight maybe 20 to 30 pounds partly intentional partly unintentional.  She has had diarrhea for more than a few years.  Her brother has Crohn's disease.  She has 2-3 bowel movements a day.  Watery and at times has blood in it.  None presently.  Denies any long-term NSAID use.  She has symptoms of urgency nocturnal bowel movements and cramping.  Some joint pains she had some rash on the back which has improved recently.  No prior colonoscopy.  No family history of colon cancer or polyps.  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 ? ? ?Past Medical History:  ?Diagnosis Date  ? Acne   ? Anxiety   ? Asthma   ? Breast lump on right side at 12 o'clock position   ? Depression   ? Fatigue   ? Hyperglycemia   ? Insomnia   ? Irritable bowel syndrome   ? Numerous moles   ? Vitamin B 12 deficiency   ? Vitamin D deficiency   ? ? ?Past Surgical History:  ?Procedure  Laterality Date  ? CHOLECYSTECTOMY    ? TONSILLECTOMY AND ADENOIDECTOMY    ? ? ?Prior to Admission medications   ?Medication Sig Start Date End Date Taking? Authorizing Provider  ?albuterol (VENTOLIN HFA) 108 (90 Base) MCG/ACT inhaler INHALE 2 PUFFS INTO THE LUNGS EVERY 6 (SIX) HOURS AS NEEDED FOR WHEEZING OR SHORTNESS OF BREATH 09/26/20 09/26/21  Steele Sizer, MD  ?busPIRone (BUSPAR) 15 MG tablet Take 1 twice daily 12/27/21     ?Cholecalciferol 25 MCG (1000 UT) tablet Take by mouth.    [provider]  ?clonazePAM (KLONOPIN) 0.5 MG tablet TAKE 1 TABLET BY MOUTH ONCE DAILY DURING WEEK PRIOR TO MENSES 01/10/21 07/09/21  Chauncey Mann, MD  ?Cyanocobalamin (VITAMIN B-12) 1000 MCG SUBL Place 1 tablet under the tongue daily.    [provider]  ?lamoTRIgine (LAMICTAL) 200 MG tablet Take 1 tablet by mouth daily 04/10/21     ?lamoTRIgine (LAMICTAL) 25 MG tablet Take 2 at bedtime 12/27/21     ?ondansetron (ZOFRAN) 4 MG tablet Take 1 tablet (4 mg total) by mouth every 8 (eight) hours as needed for nausea or vomiting. 03/22/22   Steele Sizer, MD  ?traZODone (DESYREL) 100 MG tablet Take 1-2 tablets po nightly prn sleep 12/27/21     ? ? ?  Family History  ?Problem Relation Age of Onset  ? Hypertension Mother   ? Other Father   ?     post-polio syndrome  ? Sudden Cardiac Death Neg Hx   ?  ? ?Social History  ? ?Tobacco Use  ? Smoking status: Former  ?  Packs/day: 0.50  ?  Years: 5.00  ?  Pack years: 2.50  ?  Types: Cigarettes  ?  Start date: 12/04/2003  ?  Quit date: 11/04/2009  ?  Years since quitting: 12.4  ? Smokeless tobacco: Never  ?Vaping Use  ? Vaping Use: Never used  ?Substance Use Topics  ? Alcohol use: Yes  ?  Alcohol/week: 0.0 standard drinks  ?  Comment: Drink 2x per year  ? Drug use: Yes  ?  Types: Marijuana  ? ? ?Allergies as of 03/29/2022 - Review Complete 03/29/2022  ?Allergen Reaction Noted  ? Amoxil [amoxicillin]  02/18/2017  ? ? ?Review of Systems:    ?All systems reviewed and negative except where  noted in HPI. ? ? Physical Exam:  ?BP 125/73   Pulse 68   Temp 97.8 ?F (36.6 ?C) (Oral)   Ht '5\' 9"'$  (1.753 m)   Wt 141 lb (64 kg)   BMI 20.82 kg/m?  ?No LMP recorded. (Menstrual status: Irregular Periods). ?Psych:  Alert and cooperative. Normal mood and affect. ?General:   Alert,  Well-developed, well-nourished, pleasant and cooperative in NAD ?Head:  Normocephalic and atraumatic. ?Eyes:  Sclera clear, no icterus.   Conjunctiva pink. ?Ears:  Normal auditory acuity. ?Lungs:  Respirations even and unlabored.  Clear throughout to auscultation.   No wheezes, crackles, or rhonchi. No acute distress. ?Heart:  Regular rate and rhythm; no murmurs, clicks, rubs, or gallops. ?Abdomen:  Normal bowel sounds.  No bruits.  Soft, non-tender and non-distended without masses, hepatosplenomegaly or hernias noted.  No guarding or rebound tenderness.    ?Neurologic:  Alert and oriented x3;  grossly normal neurologically. ?Psych:  Alert and cooperative. Normal mood and affect. ? ?Imaging Studies: ?CT Abdomen Pelvis W Contrast ? ?Result Date: 03/22/2022 ?CLINICAL DATA:  Concern for appendicitis. EXAM: CT ABDOMEN AND PELVIS WITH CONTRAST TECHNIQUE: Multidetector CT imaging of the abdomen and pelvis was performed using the standard protocol following bolus administration of intravenous contrast. RADIATION DOSE REDUCTION: This exam was performed according to the departmental dose-optimization program which includes automated exposure control, adjustment of the mA and/or kV according to patient size and/or use of iterative reconstruction technique. CONTRAST:  147m OMNIPAQUE IOHEXOL 300 MG/ML  SOLN COMPARISON:  04/28/2021 FINDINGS: Lower chest: Limited visualization of the lower thorax is negative for focal airspace opacity or pleural effusion. Note is made of an approximately 1.1 cm cyst within the imaged left lower lung (image 2, series 3). No pleural effusion. Normal heart size.  No pericardial effusion. Hepatobiliary: Normal hepatic  contour. No discrete hepatic lesions. Post cholecystectomy. No intra extrahepatic bili duct dilatation. No ascites. Pancreas: Normal appearance of the pancreas. Spleen: Normal appearance of the spleen. Adrenals/Urinary Tract: There is symmetric enhancement of the bilateral kidneys. No evidence of nephrolithiasis on this postcontrast examination. No discrete renal lesions. No urinary obstruction or perinephric stranding. Normal appearance the bilateral adrenal glands. Normal appearance of the urinary bladder given degree of distention. Stomach/Bowel: Ingested enteric contrast extends to the level of the hepatic flexure of the colon. There is diffuse colonic wall thickening extending from the mid aspect of the transverse colon through the descending colon and sigmoid colon to the level of  the rectum, not resulting in enteric obstruction. No evidence of perforation or definable/drainable fluid collection. No definitive evidence of enteric fistula. Normal appearance of the terminal ileum and diminutive retrocecal appendix. No hiatal hernia. No pneumoperitoneum, pneumatosis or portal venous gas. Vascular/Lymphatic: Normal caliber the abdominal aorta. The major branch vessels of the abdominal aorta appear patent on this non CTA examination. No bulky retroperitoneal, mesenteric, pelvic or inguinal lymphadenopathy. Reproductive: Normal appearance of the pelvic organs. No discrete adnexal lesions. No free fluid the pelvic cul-de-sac. Other: Regional soft tissues appear normal. Musculoskeletal: No acute or aggressive osseous abnormalities. IMPRESSION: 1. Diffuse colonic wall thickening extending from the mid transverse colon to the rectum. Findings are nonspecific though could be seen in a infectious or inflammatory enteritis. Clinical correlation is advised. Further evaluation with colonoscopy could be performed as indicated. 2. No evidence of appendicitis. Electronically Signed   By: Sandi Mariscal M.D.   On: 03/22/2022 15:26    ? ?Assessment and Plan:  ? ?Ashley Patel is a 41 y.o. y/o female has been referred for colitis.  History of diarrhea for more than a few months.  Occasional blood cramping.  Colitis seen on the CAT scan recently.

## 2022-03-30 ENCOUNTER — Other Ambulatory Visit: Payer: Self-pay

## 2022-03-30 LAB — COMPREHENSIVE METABOLIC PANEL
ALT: 12 IU/L (ref 0–32)
AST: 12 IU/L (ref 0–40)
Albumin/Globulin Ratio: 2 (ref 1.2–2.2)
Albumin: 4.5 g/dL (ref 3.8–4.8)
Alkaline Phosphatase: 83 IU/L (ref 44–121)
BUN/Creatinine Ratio: 18 (ref 9–23)
BUN: 15 mg/dL (ref 6–20)
Bilirubin Total: 0.4 mg/dL (ref 0.0–1.2)
CO2: 26 mmol/L (ref 20–29)
Calcium: 9.6 mg/dL (ref 8.7–10.2)
Chloride: 102 mmol/L (ref 96–106)
Creatinine, Ser: 0.85 mg/dL (ref 0.57–1.00)
Globulin, Total: 2.2 g/dL (ref 1.5–4.5)
Glucose: 86 mg/dL (ref 70–99)
Potassium: 4.4 mmol/L (ref 3.5–5.2)
Sodium: 140 mmol/L (ref 134–144)
Total Protein: 6.7 g/dL (ref 6.0–8.5)
eGFR: 90 mL/min/{1.73_m2} (ref >59)

## 2022-03-30 LAB — CBC WITH DIFFERENTIAL/PLATELET
Basophils Absolute: 0 10*3/uL (ref 0.0–0.2)
Basos: 1 %
EOS (ABSOLUTE): 0.2 10*3/uL (ref 0.0–0.4)
Eos: 4 %
Hematocrit: 41.2 % (ref 34.0–46.6)
Hemoglobin: 13.5 g/dL (ref 11.1–15.9)
Immature Grans (Abs): 0 10*3/uL (ref 0.0–0.1)
Immature Granulocytes: 0 %
Lymphocytes Absolute: 2.1 10*3/uL (ref 0.7–3.1)
Lymphs: 41 %
MCH: 28.8 pg (ref 26.6–33.0)
MCHC: 32.8 g/dL (ref 31.5–35.7)
MCV: 88 fL (ref 79–97)
Monocytes Absolute: 0.4 10*3/uL (ref 0.1–0.9)
Monocytes: 9 %
Neutrophils Absolute: 2.3 10*3/uL (ref 1.4–7.0)
Neutrophils: 45 %
Platelets: 252 10*3/uL (ref 150–450)
RBC: 4.69 x10E6/uL (ref 3.77–5.28)
RDW: 12 % (ref 11.7–15.4)
WBC: 5.1 10*3/uL (ref 3.4–10.8)

## 2022-03-30 LAB — TSH: TSH: 3.67 u[IU]/mL (ref 0.450–4.500)

## 2022-03-30 LAB — C-REACTIVE PROTEIN: CRP: <1 mg/L (ref 0–10)

## 2022-03-31 LAB — CELIAC DISEASE AB SCREEN W/RFX
Antigliadin Abs, IgA: 5 units (ref 0–19)
IgA/Immunoglobulin A, Serum: 179 mg/dL (ref 87–352)
Transglutaminase IgA: <2 U/mL (ref 0–3)

## 2022-04-06 ENCOUNTER — Other Ambulatory Visit: Payer: Self-pay

## 2022-04-06 DIAGNOSIS — F39 Unspecified mood [affective] disorder: Secondary | ICD-10-CM | POA: Diagnosis not present

## 2022-04-06 DIAGNOSIS — F5105 Insomnia due to other mental disorder: Secondary | ICD-10-CM | POA: Diagnosis not present

## 2022-04-06 DIAGNOSIS — F1210 Cannabis abuse, uncomplicated: Secondary | ICD-10-CM | POA: Diagnosis not present

## 2022-04-06 DIAGNOSIS — F411 Generalized anxiety disorder: Secondary | ICD-10-CM | POA: Diagnosis not present

## 2022-04-06 MED ORDER — LAMOTRIGINE 200 MG PO TABS
ORAL_TABLET | Freq: Every day | ORAL | 0 refills | Status: DC
Start: 2022-04-06 — End: 2022-05-15
  Filled 2022-04-06: qty 90, 90d supply, fill #0

## 2022-04-06 MED ORDER — LAMOTRIGINE 25 MG PO TABS
ORAL_TABLET | ORAL | 0 refills | Status: DC
  Filled 2022-04-06: qty 90, 90d supply, fill #0

## 2022-04-06 MED ORDER — BUSPIRONE HCL 15 MG PO TABS
ORAL_TABLET | ORAL | 0 refills | Status: DC
  Filled 2022-04-06: qty 180, 90d supply, fill #0

## 2022-04-06 MED ORDER — CITALOPRAM HYDROBROMIDE 20 MG PO TABS
ORAL_TABLET | ORAL | 0 refills | Status: DC
  Filled 2022-04-06: qty 90, 90d supply, fill #0

## 2022-04-06 MED ORDER — TRAZODONE HCL 100 MG PO TABS
ORAL_TABLET | ORAL | 0 refills | Status: DC
  Filled 2022-04-06: qty 180, 90d supply, fill #0

## 2022-04-06 MED ORDER — CLONAZEPAM 0.5 MG PO TABS
ORAL_TABLET | ORAL | 2 refills | Status: DC
  Filled 2022-04-06: qty 5, 30d supply, fill #0
  Filled 2022-08-14: qty 5, 30d supply, fill #1

## 2022-04-10 ENCOUNTER — Encounter: Payer: Self-pay | Admitting: Family Medicine

## 2022-04-11 ENCOUNTER — Other Ambulatory Visit: Payer: Self-pay | Admitting: Gastroenterology

## 2022-04-11 DIAGNOSIS — R197 Diarrhea, unspecified: Secondary | ICD-10-CM | POA: Diagnosis not present

## 2022-04-15 LAB — C DIFFICILE TOXINS A+B W/RFLX: C difficile Toxins A+B, EIA: NEGATIVE

## 2022-04-15 LAB — C DIFFICILE, CYTOTOXIN B

## 2022-04-16 NOTE — Progress Notes (Signed)
Normal stool studies

## 2022-04-18 ENCOUNTER — Telehealth: Payer: Self-pay | Admitting: Gastroenterology

## 2022-04-18 ENCOUNTER — Other Ambulatory Visit: Payer: Self-pay

## 2022-04-18 NOTE — Telephone Encounter (Signed)
Pt left message would like a call back had a question about her prep ?

## 2022-04-19 ENCOUNTER — Ambulatory Visit
Admission: RE | Admit: 2022-04-19 | Discharge: 2022-04-19 | Disposition: A | Discharge status: Home / Self Care | Payer: BC Managed Care – PPO | Attending: Gastroenterology | Admitting: Gastroenterology

## 2022-04-19 ENCOUNTER — Encounter
Admission: RE | Disposition: A | Discharge status: Home / Self Care | Payer: Self-pay | Source: Home / Self Care | Attending: Gastroenterology

## 2022-04-19 ENCOUNTER — Encounter: Payer: Self-pay | Admitting: Gastroenterology

## 2022-04-19 ENCOUNTER — Ambulatory Visit: Payer: BC Managed Care – PPO | Admitting: Certified Registered Nurse Anesthetist

## 2022-04-19 DIAGNOSIS — K2950 Unspecified chronic gastritis without bleeding: Secondary | ICD-10-CM | POA: Diagnosis not present

## 2022-04-19 DIAGNOSIS — Z6820 Body mass index (BMI) 20.0-20.9, adult: Secondary | ICD-10-CM | POA: Insufficient documentation

## 2022-04-19 DIAGNOSIS — K2289 Other specified disease of esophagus: Secondary | ICD-10-CM | POA: Insufficient documentation

## 2022-04-19 DIAGNOSIS — K529 Noninfective gastroenteritis and colitis, unspecified: Secondary | ICD-10-CM | POA: Insufficient documentation

## 2022-04-19 DIAGNOSIS — F32A Depression, unspecified: Secondary | ICD-10-CM | POA: Diagnosis not present

## 2022-04-19 DIAGNOSIS — J45909 Unspecified asthma, uncomplicated: Secondary | ICD-10-CM | POA: Diagnosis not present

## 2022-04-19 DIAGNOSIS — F419 Anxiety disorder, unspecified: Secondary | ICD-10-CM | POA: Insufficient documentation

## 2022-04-19 DIAGNOSIS — K635 Polyp of colon: Secondary | ICD-10-CM | POA: Insufficient documentation

## 2022-04-19 DIAGNOSIS — Z87891 Personal history of nicotine dependence: Secondary | ICD-10-CM | POA: Diagnosis not present

## 2022-04-19 DIAGNOSIS — R634 Abnormal weight loss: Secondary | ICD-10-CM | POA: Diagnosis not present

## 2022-04-19 DIAGNOSIS — K2100 Gastro-esophageal reflux disease with esophagitis, without bleeding: Secondary | ICD-10-CM | POA: Insufficient documentation

## 2022-04-19 DIAGNOSIS — K633 Ulcer of intestine: Secondary | ICD-10-CM | POA: Insufficient documentation

## 2022-04-19 DIAGNOSIS — R197 Diarrhea, unspecified: Secondary | ICD-10-CM

## 2022-04-19 HISTORY — PX: COLONOSCOPY WITH PROPOFOL: SHX5780

## 2022-04-19 HISTORY — PX: ESOPHAGOGASTRODUODENOSCOPY: SHX5428

## 2022-04-19 LAB — GI PROFILE, STOOL, PCR

## 2022-04-19 LAB — CALPROTECTIN, FECAL: Calprotectin, Fecal: 177 ug/g — ABNORMAL HIGH (ref 0–120)

## 2022-04-19 LAB — POCT PREGNANCY, URINE: Preg Test, Ur: NEGATIVE

## 2022-04-19 SURGERY — COLONOSCOPY WITH PROPOFOL
Anesthesia: General

## 2022-04-19 MED ORDER — PROPOFOL 500 MG/50ML IV EMUL
INTRAVENOUS | Status: DC | PRN
  Administered 2022-04-19: 180 ug/kg/min via INTRAVENOUS

## 2022-04-19 MED ORDER — FENTANYL CITRATE (PF) 100 MCG/2ML IJ SOLN
INTRAMUSCULAR | Status: DC | PRN
  Administered 2022-04-19 (×2): 50 ug via INTRAVENOUS

## 2022-04-19 MED ORDER — SODIUM CHLORIDE 0.9 % IV SOLN: INTRAVENOUS | Status: DC

## 2022-04-19 MED ORDER — LIDOCAINE HCL (CARDIAC) PF 100 MG/5ML IV SOSY
PREFILLED_SYRINGE | INTRAVENOUS | Status: DC | PRN
  Administered 2022-04-19: 80 mg via INTRAVENOUS

## 2022-04-19 MED ORDER — FENTANYL CITRATE (PF) 100 MCG/2ML IJ SOLN
INTRAMUSCULAR | Status: AC
Start: 2022-04-19 — End: ?
  Filled 2022-04-19: qty 2

## 2022-04-19 MED ORDER — ONDANSETRON HCL 4 MG/2ML IJ SOLN
INTRAMUSCULAR | Status: DC | PRN
  Administered 2022-04-19: 4 mg via INTRAVENOUS

## 2022-04-19 MED ORDER — ONDANSETRON HCL 4 MG/2ML IJ SOLN
INTRAMUSCULAR | Status: AC
  Filled 2022-04-19: qty 2

## 2022-04-19 MED ORDER — PROPOFOL 10 MG/ML IV BOLUS
INTRAVENOUS | Status: DC | PRN
  Administered 2022-04-19: 40 mg via INTRAVENOUS
  Administered 2022-04-19: 70 mg via INTRAVENOUS
  Administered 2022-04-19 (×2): 40 mg via INTRAVENOUS
  Administered 2022-04-19: 30 mg via INTRAVENOUS

## 2022-04-19 NOTE — Op Note (Signed)
[facility] Gastroenterology Patient Name: Ashley Patel Procedure Date: 04/19/2022 9:33 AM MRN: [id number] Account #: [id number] Date of Birth: 1981/03/02 Admit Type: Outpatient Age: 41 Room: [facility] ENDO ROOM 3 Gender: Female Note Status: Finalized Instrument Name: Upper Endoscope [id number] Procedure:             Upper GI endoscopy Indications:           Diarrhea Providers:             Jonathon Bellows MD, MD Referring MD:          No Local Md, MD (Referring MD) Medicines:             Monitored Anesthesia Care Complications:         No immediate complications. Procedure:             Pre-Anesthesia Assessment:                        - Prior to the procedure, a History and Physical was                         performed, and patient medications, allergies and                         sensitivities were reviewed. The patient's tolerance                         of previous anesthesia was reviewed.                        - The risks and benefits of the procedure and the                         sedation options and risks were discussed with the                         patient. All questions were answered and informed                         consent was obtained.                        - ASA Grade Assessment: II - A patient with mild                         systemic disease.                        After obtaining informed consent, the endoscope was                         passed under direct vision. Throughout the procedure,                         the patient's blood pressure, pulse, and oxygen                         saturations were monitored continuously. The Endoscope                         was introduced  through the mouth, and advanced to the                         third part of duodenum. The upper GI endoscopy was                         accomplished with ease. The patient tolerated the                         procedure well. Findings:      Localized mild mucosal changes  characterized by altered texture were       found at the gastroesophageal junction. Biopsies were taken with a cold       forceps for histology.      The entire examined stomach was normal. Biopsies were taken with a cold       forceps for histology.      The examined duodenum was normal. Biopsies were taken with a cold       forceps for histology.      The cardia and gastric fundus were normal on retroflexion. Impression:            - Texture changed mucosa in the esophagus. Biopsied.                        - Normal stomach. Biopsied.                        - Normal examined duodenum. Biopsied. Recommendation:        - Await pathology results.                        - Perform a colonoscopy today. Procedure Code(s):     --- Professional ---                        [phone], Esophagogastroduodenoscopy, flexible,                         transoral; with biopsy, single or multiple Diagnosis Code(s):     --- Professional ---                        K22.8, Other specified diseases of esophagus                        R19.7, Diarrhea, unspecified CPT copyright 2019 American Medical Association. All rights reserved. The codes documented in this report are preliminary and upon coder review may  be revised to meet current compliance requirements. Jonathon Bellows, MD Jonathon Bellows MD, MD 04/19/2022 9:49:12 AM This report has been signed electronically. Number of Addenda: 0 Note Initiated On: 04/19/2022 9:33 AM Estimated Blood Loss:  Estimated blood loss: none.      [facility]

## 2022-04-19 NOTE — Transfer of Care (Signed)
Immediate Anesthesia Transfer of Care Note  Patient: Ashley Patel  Procedure(s) Performed: COLONOSCOPY WITH PROPOFOL ESOPHAGOGASTRODUODENOSCOPY (EGD)  Patient Location: PACU  Anesthesia Type:General  Level of Consciousness: awake and alert   Airway & Oxygen Therapy: Patient Spontanous Breathing  Post-op Assessment: Report given to RN and Post -op Vital signs reviewed and stable  Post vital signs: Reviewed and stable  Last Vitals:  Vitals Value Taken Time  BP 109/62 04/19/22 1007  Temp    Pulse 131 04/19/22 1006  Resp 13 04/19/22 1007  SpO2 97 % 04/19/22 1006  Vitals shown include unvalidated device data.  Last Pain:  Vitals:   04/19/22 0904  TempSrc: Temporal  PainSc: 2          Complications: No notable events documented.

## 2022-04-19 NOTE — Op Note (Signed)
[facility] Gastroenterology Patient Name: Ashley Patel Procedure Date: 04/19/2022 9:32 AM MRN: [id number] Account #: [id number] Date of Birth: 1981/04/06 Admit Type: Outpatient Age: 41 Room: [facility] ENDO ROOM 3 Gender: Female Note Status: Finalized Instrument Name: Jasper Riling [id number] Procedure:             Colonoscopy Indications:           Chronic diarrhea Providers:             Jonathon Bellows MD, MD Referring MD:          No Local Md, MD (Referring MD) Medicines:             Monitored Anesthesia Care Complications:         No immediate complications. Procedure:             Pre-Anesthesia Assessment:                        - Prior to the procedure, a History and Physical was                         performed, and patient medications, allergies and                         sensitivities were reviewed. The patient's tolerance                         of previous anesthesia was reviewed.                        - The risks and benefits of the procedure and the                         sedation options and risks were discussed with the                         patient. All questions were answered and informed                         consent was obtained.                        - ASA Grade Assessment: II - A patient with mild                         systemic disease.                        After obtaining informed consent, the colonoscope was                         passed under direct vision. Throughout the procedure,                         the patient's blood pressure, pulse, and oxygen                         saturations were monitored continuously. The                         Colonoscope was introduced through the  anus and                         advanced to the the terminal ileum. The colonoscopy                         was performed with ease. The patient tolerated the                         procedure well. The quality of the bowel preparation                          was excellent. Findings:      The terminal ileum contained a few three mm ulcers. No bleeding was       present. No stigmata of recent bleeding were seen. Biopsies were taken       with a cold forceps for histology.      The colon (entire examined portion) appeared normal. Biopsies were taken       with a cold forceps for histology.      The exam was otherwise without abnormality on direct and retroflexion       views.      A 5 mm polyp was found in the descending colon. The polyp was sessile.       The polyp was removed with a cold snare. Resection and retrieval were       complete. Impression:            - A few ulcers in the terminal ileum. Biopsied.                        - The entire examined colon is normal. Biopsied.                        - The examination was otherwise normal on direct and                         retroflexion views. Recommendation:        - Discharge patient to home (with escort).                        - Resume previous diet.                        - Continue present medications.                        - Await pathology results.                        - Repeat colonoscopy for surveillance based on                         pathology results.                        - Return to my office as previously scheduled. Procedure Code(s):     --- Professional ---                        [phone], Colonoscopy, flexible; with removal of  tumor(s), polyp(s), or other lesion(s) by snare                         technique                        [id number], 64, Colonoscopy, flexible; with biopsy, single                         or multiple Diagnosis Code(s):     --- Professional ---                        K63.3, Ulcer of intestine                        K52.9, Noninfective gastroenteritis and colitis,                         unspecified CPT copyright 2019 American Medical Association. All rights reserved. The codes documented in this report are preliminary and  upon coder review may  be revised to meet current compliance requirements. Jonathon Bellows, MD Jonathon Bellows MD, MD 04/19/2022 10:07:52 AM This report has been signed electronically. Number of Addenda: 0 Note Initiated On: 04/19/2022 9:32 AM Scope Withdrawal Time: 0 hours 8 minutes 19 seconds  Total Procedure Duration: 0 hours 12 minutes 53 seconds  Estimated Blood Loss:  Estimated blood loss: none.      [facility]
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
## 2022-04-19 NOTE — Anesthesia Preprocedure Evaluation (Signed)
Anesthesia Evaluation  Patient identified by MRN, date of birth, ID band Patient awake    Reviewed: Allergy & Precautions, H&P , NPO status , Patient's Chart, lab work & pertinent test results, reviewed documented beta blocker date and time   History of Anesthesia Complications (+) PONV and history of anesthetic complications  Airway Mallampati: I  TM Distance: >3 FB Neck ROM: full    Dental  (+) Dental Advidsory Given, Teeth Intact   Pulmonary neg shortness of breath, asthma , neg sleep apnea, neg COPD, neg recent URI, former smoker,    Pulmonary exam normal breath sounds clear to auscultation       Cardiovascular Exercise Tolerance: Good negative cardio ROS Normal cardiovascular exam Rhythm:regular Rate:Normal     Neuro/Psych PSYCHIATRIC DISORDERS Anxiety Depression negative neurological ROS     GI/Hepatic Neg liver ROS, GERD  ,  Endo/Other  negative endocrine ROS  Renal/GU negative Renal ROS  negative genitourinary   Musculoskeletal   Abdominal   Peds  Hematology negative hematology ROS (+)   Anesthesia Other Findings Past Medical History: No date: Acne No date: Anxiety No date: Asthma No date: Breast lump on right side at 12 o'clock position No date: Depression No date: Fatigue No date: Hyperglycemia No date: Insomnia No date: Irritable bowel syndrome No date: Numerous moles No date: Vitamin B 12 deficiency No date: Vitamin D deficiency   Reproductive/Obstetrics negative OB ROS                             Anesthesia Physical Anesthesia Plan  ASA: 2  Anesthesia Plan: General   Post-op Pain Management:    Induction: Intravenous  PONV Risk Score and Plan: 4 or greater and Propofol infusion and TIVA  Airway Management Planned: Natural Airway and Nasal Cannula  Additional Equipment:   Intra-op Plan:   Post-operative Plan:   Informed Consent: I have reviewed the  patients History and Physical, chart, labs and discussed the procedure including the risks, benefits and alternatives for the proposed anesthesia with the patient or authorized representative who has indicated his/her understanding and acceptance.     Dental Advisory Given  Plan Discussed with: Anesthesiologist, CRNA and Surgeon  Anesthesia Plan Comments:         Anesthesia Quick Evaluation

## 2022-04-19 NOTE — Anesthesia Procedure Notes (Signed)
Date/Time: 04/19/2022 9:40 AM Performed by: Demetrius Charity, CRNA Pre-anesthesia Checklist: Patient identified, Emergency Drugs available, Suction available, Patient being monitored and Timeout performed Patient Re-evaluated:Patient Re-evaluated prior to induction Oxygen Delivery Method: Nasal cannula Induction Type: IV induction Placement Confirmation: CO2 detector and positive ETCO2

## 2022-04-20 ENCOUNTER — Encounter: Payer: Self-pay | Admitting: Gastroenterology

## 2022-04-20 LAB — SURGICAL PATHOLOGY

## 2022-04-20 NOTE — Anesthesia Postprocedure Evaluation (Signed)
Anesthesia Post Note  Patient: Ashley Patel  Procedure(s) Performed: COLONOSCOPY WITH PROPOFOL ESOPHAGOGASTRODUODENOSCOPY (EGD)  Patient location during evaluation: Endoscopy Anesthesia Type: General Level of consciousness: awake and alert Pain management: pain level controlled Vital Signs Assessment: post-procedure vital signs reviewed and stable Respiratory status: spontaneous breathing, nonlabored ventilation, respiratory function stable and patient connected to nasal cannula oxygen Cardiovascular status: blood pressure returned to baseline and stable Postop Assessment: no apparent nausea or vomiting Anesthetic complications: no   No notable events documented.   Last Vitals:  Vitals:   04/19/22 1006 04/19/22 1016  BP: 109/62 97/68  Pulse: 68   Resp: 11   Temp: 36.4 C   SpO2: 93%     Last Pain:  Vitals:   04/20/22 0735  TempSrc:   PainSc: 0-No pain                 Martha Clan

## 2022-04-24 NOTE — Patient Instructions (Incomplete)
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
## 2022-04-25 ENCOUNTER — Encounter: Payer: BC Managed Care – PPO | Admitting: Family Medicine

## 2022-04-25 DIAGNOSIS — Z0000 Encounter for general adult medical examination without abnormal findings: Secondary | ICD-10-CM

## 2022-05-06 NOTE — Progress Notes (Signed)
Inform biopsies shows gastritis and esophagitis- no abnormalities seen on other biopsies but some ulcers were seen in the terminal ileum, ensure no nsaids, check fecal calprotectin if still having diarrhea and if still having diarrhea also get mr enterography to rule out crohns

## 2022-05-08 ENCOUNTER — Telehealth: Payer: Self-pay

## 2022-05-08 DIAGNOSIS — R197 Diarrhea, unspecified: Secondary | ICD-10-CM

## 2022-05-08 NOTE — Telephone Encounter (Signed)
Called patient about her results and she understands also she is still having dirrehea so ordered fecal cal per dr Vicente Males an enterography  enterography is scheduled for 05/21/2022 npo 4 hours prior  arrival time is 9:45 am

## 2022-05-14 DIAGNOSIS — R197 Diarrhea, unspecified: Secondary | ICD-10-CM | POA: Diagnosis not present

## 2022-05-15 ENCOUNTER — Other Ambulatory Visit: Payer: Self-pay

## 2022-05-15 ENCOUNTER — Telehealth (INDEPENDENT_AMBULATORY_CARE_PROVIDER_SITE_OTHER): Payer: BC Managed Care – PPO | Admitting: Gastroenterology

## 2022-05-15 DIAGNOSIS — R197 Diarrhea, unspecified: Secondary | ICD-10-CM

## 2022-05-15 MED ORDER — CHOLESTYRAMINE 4 G PO PACK
4.0000 g | PACK | Freq: Two times a day (BID) | ORAL | 12 refills | Status: DC
  Filled 2022-05-15: qty 60, 30d supply, fill #0

## 2022-05-15 NOTE — Progress Notes (Signed)
Ashley Patel , MD [address]  [place]  [place], [place] [address]  Main: [phone]  Fax: [phone]   Primary Care Physician: Steele Sizer, MD  Virtual Visit via Video Note  I connected with patient on 05/15/22 at  1:00 PM EDT by video and verified that I am speaking with the correct person using two identifiers.   I discussed the limitations, risks, security and privacy concerns of performing an evaluation and management service by video  and the availability of in person appointments. I also discussed with the patient that there may be a patient responsible charge related to this service. The patient expressed understanding and agreed to proceed.  Location of Patient: Home Location of Provider: Home Persons involved: Patient and provider only   History of Present Illness: Chief Complaint  Patient presents with   Ulcerative Colitis    HPI: Ashley Patel is a 41 y.o. female  Ashley Patel of history :  She went to her 22 office with abdominal pain the right side on 03/22/2022.  At that point of time she states she has lost weight 20-30 lbs  over the past 2 years.  Subsequently she underwent a CT scan of the abdomen pelvis with contrast on 03/22/2022 that showed diffuse colonic wall thickening extending from the mid transverse colon to the rectum/suspicious for infectious or inflammatory enteritis.  Colonoscopy was recommended.  A similar CT was performed in May 2022 and no acute findings were noted.     She has had diarrhea for more than a few years.  Her brother has Crohn's disease.  She has 2-3 bowel movements a day.  Watery and at times has blood in it.  None presently.  Denies any long-term NSAID use.  She has symptoms of urgency nocturnal bowel movements and cramping.  Some joint pains she had some rash on the back which has improved recently.  No prior colonoscopy.  No family history of colon cancer or polyps.  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       Interval history  03/29/2022-05/15/2022   04/19/2022: EGD: inflammation at GE junction : esophagitis , chronic inactive gastritis , normal duoidenal mucosa , colonoscopy : small 5 mm polyp-benign  resected, aphthous ulcers in terminal ileum seen   Random colon bx were normal , TI bx showed lymphoid hyperplasia.  She states that she continues to have diarrhea and occasional abdominal pain.  Denies any NS      Current Outpatient Medications  Medication Sig Dispense Refill   busPIRone (BUSPAR) 15 MG tablet Take 1 twice daily 180 tablet 0   cholestyramine (QUESTRAN) 4 g packet Take 1 packet (4 g total) by mouth 2 (two) times daily. 60 each 12   citalopram (CELEXA) 20 MG tablet Take 1 daily  with breakfast 90 tablet 0   clonazePAM (KLONOPIN) 0.5 MG tablet Take 1 daily during week prior to menses 5 tablet 2   lamoTRIgine (LAMICTAL) 200 MG tablet Take 1 tablet by mouth daily 90 tablet 0   lamoTRIgine (LAMICTAL) 25 MG tablet Take 2 at bedtime 90 tablet 0   ondansetron (ZOFRAN) 4 MG tablet Take 1 tablet (4 mg total) by mouth every 8 (eight) hours as needed for nausea or vomiting. 20 tablet 0   traZODone (DESYREL) 100 MG tablet Take 1-2 tablets po nightly prn sleep 180 tablet 0   No current facility-administered medications for this visit.  Allergies as of 05/15/2022 - Review Complete 05/15/2022  Allergen Reaction Noted   Amoxil [amoxicillin]  02/18/2017    Review of Systems:    All systems reviewed and negative except where noted in HPI.  General Appearance:    Alert, cooperative, no distress, appears stated age  Head:    Normocephalic, without obvious abnormality, atraumatic  Eyes:    PERRL, conjunctiva/corneas clear,  Ears:    Grossly normal hearing    Neurologic:  Grossly normal    Observations/Objective:  Labs: CMP     Component Value Date/Time   NA 140 03/29/2022 1530   K 4.4 03/29/2022 1530   CL 102 03/29/2022 1530   CO2 26 03/29/2022 1530   GLUCOSE  86 03/29/2022 1530   GLUCOSE 88 06/28/2021 0907   BUN 15 03/29/2022 1530   CREATININE 0.85 03/29/2022 1530   CREATININE 0.85 06/28/2021 0907   CALCIUM 9.6 03/29/2022 1530   PROT 6.7 03/29/2022 1530   ALBUMIN 4.5 03/29/2022 1530   AST 12 03/29/2022 1530   ALT 12 03/29/2022 1530   ALKPHOS 83 03/29/2022 1530   BILITOT 0.4 03/29/2022 1530   GFRNONAA >60 04/28/2021 1421   GFRNONAA 111 03/25/2020 1028   GFRAA 129 03/25/2020 1028   Lab Results  Component Value Date   WBC 5.1 03/29/2022   HGB 13.5 03/29/2022   HCT 41.2 03/29/2022   MCV 88 03/29/2022   PLT 252 03/29/2022    Imaging Studies: No results found.  Assessment and Plan:   Ashley Patel is a 42 y.o. y/o female here to follow-up for abdominal pain and diarrhea.  Brother has Crohn's disease.  I performed EGD and colonoscopy and found aphthous ulceration in the terminal ileum biopsies Ashley Patel appeared completely normal on pathology.  Biopsies of the remainder of the colon were also normal.  She has a history of a cholecystectomy.  My initial differentials were post cholecystectomy bile salt diarrhea versus Crohn's disease.  Fecal calprotectin is elevated.  Plan 1.  Trial of cholestyramine for bile salt mediated diarrhea. 2.  Await results of MR enterography to evaluate small bowel.  If there is any signs of small bowel inflammation then may need to reconsider diagnosis of Crohn's disease and repeat ileoscopy with biopsies.  I will also plan to repeat a fecal calprotectin in next visit 3.  Follow-up in 6 weeks      I discussed the assessment and treatment plan with the patient. The patient was provided an opportunity to ask questions and all were answered. The patient agreed with the plan and demonstrated an understanding of the instructions.   The patient was advised to call back or seek an in-person evaluation if the symptoms worsen or if the condition fails to improve as anticipated.  I provided 13 minutes of face-to-face  time during this encounter.  Dr Ashley Bellows MD,MRCP [facility]) Gastroenterology/Hepatology Pager: [phone]   Speech recognition software was used to dictate this note.

## 2022-05-16 ENCOUNTER — Other Ambulatory Visit: Payer: Self-pay

## 2022-05-21 ENCOUNTER — Ambulatory Visit
Admission: RE | Admit: 2022-05-21 | Discharge: 2022-05-21 | Disposition: A | Discharge status: Home / Self Care | Payer: BC Managed Care – PPO | Source: Ambulatory Visit | Attending: Gastroenterology | Admitting: Gastroenterology

## 2022-05-21 DIAGNOSIS — R197 Diarrhea, unspecified: Secondary | ICD-10-CM

## 2022-05-21 DIAGNOSIS — K3189 Other diseases of stomach and duodenum: Secondary | ICD-10-CM | POA: Diagnosis not present

## 2022-05-21 DIAGNOSIS — K6389 Other specified diseases of intestine: Secondary | ICD-10-CM | POA: Diagnosis not present

## 2022-05-21 DIAGNOSIS — K529 Noninfective gastroenteritis and colitis, unspecified: Secondary | ICD-10-CM | POA: Diagnosis not present

## 2022-05-21 IMAGING — MR MR ENTEROGRAPHY W/ CM
15 series · 48 of 48 positions shown · IV contrast (gadavist)
Comparison: CT abdomen pelvis, [DATE], [DATE]

CLINICAL DATA: Rule out Crohn's disease, history of colitis

EXAM:
MR ABDOMEN AND PELVIS WITHOUT AND WITH CONTRAST (MR ENTEROGRAPHY)
TECHNIQUE: Multiplanar, multisequence MRI of the abdomen and pelvis was
performed both before and during bolus administration of intravenous
contrast. Negative oral contrast VoLumen was given.
CONTRAST:  6mL GADAVIST GADOBUTROL 1 MMOL/ML IV SOLN, additional
negative oral enteric contrast

[Series 6: t2_haste_tra_p2_mbh_comp · axial · 6.0mm · 1.25mm/px · 1 of 74 slices shown]
[im 1/74]
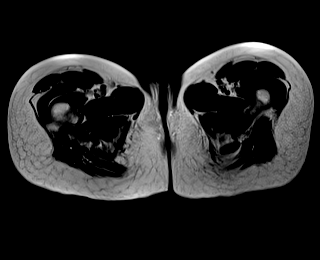

[Series 7: cor haste mbh · coronal · 5.0mm · 0.88mm/px · 1 of 41 slices shown]
[im 1/41]
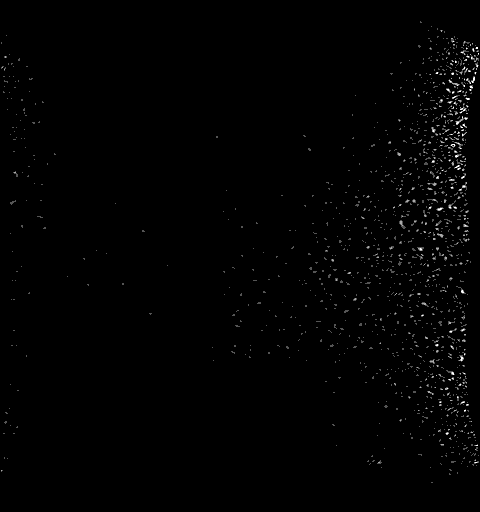

[Series 12: ax dwi_adc_comp · axial · 6.0mm · 1.49mm/px · 1 of 76 slices shown]
[im 1/76]
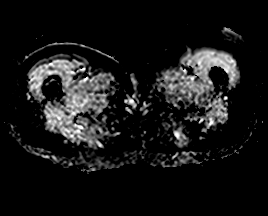

[Series 13: ax dwi_tracew_comp · axial · 6.0mm · 1.49mm/px · z∈[-337,+113]mm · 5 of 228 slices shown]
[im 1/228]
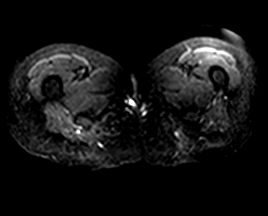
[im 57/228]
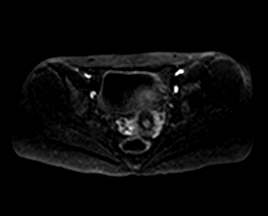
[im 114/228]
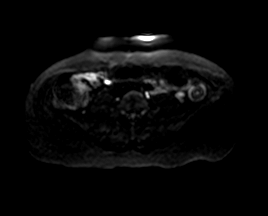
[im 171/228]
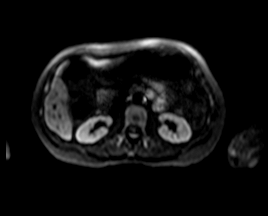
[im 228/228]
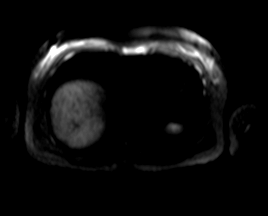

[Series 14: cor true fisp · coronal · 5.0mm · 0.70mm/px · 1 of 41 slices shown]
[im 1/41]
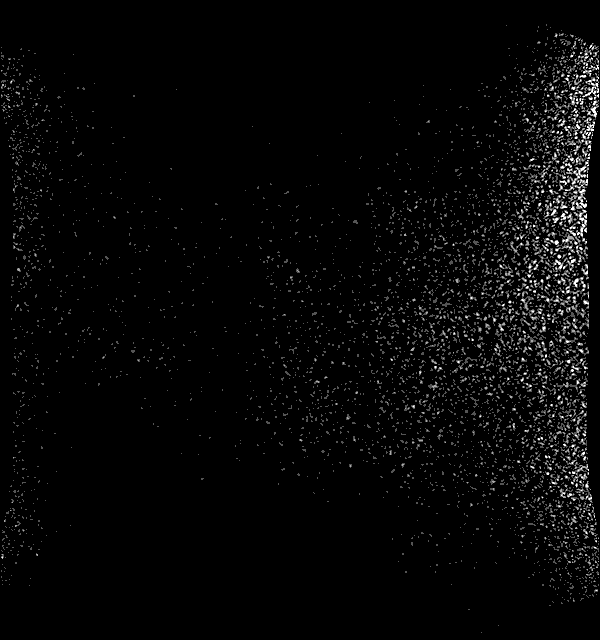

[Series 15: T1 dynamic · axial · 3.0mm · 1.64mm/px · z∈[-283,+98]mm · 3 of 128 slices shown (1 of 10)]
[im 1/128]
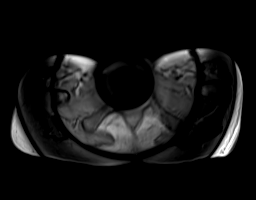
[im 64/128]
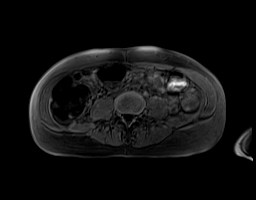
[im 128/128]
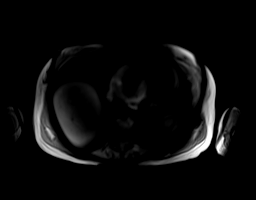

[Series 16: T1 dynamic · axial · 3.0mm · 1.64mm/px · z∈[-283,+98]mm · 4 of 128 slices shown (2 of 10)]
[im 1/128]
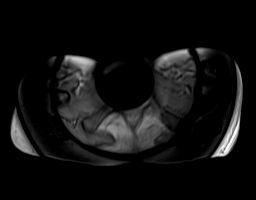
[im 43/128]
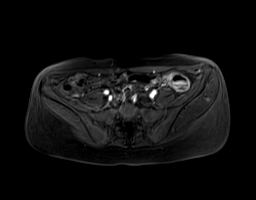
[im 85/128]
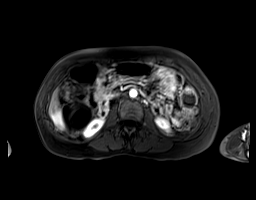
[im 128/128]
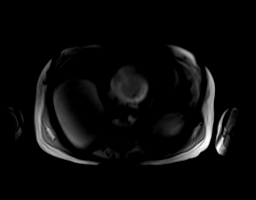

[Series 17: T1 dynamic · axial · 3.0mm · 1.64mm/px · z∈[-283,+98]mm · 4 of 128 slices shown (3 of 10)]
[im 1/128]
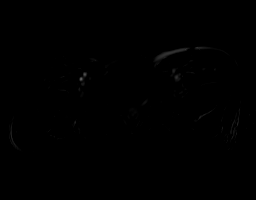
[im 43/128]
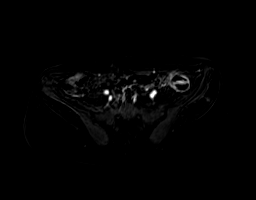
[im 85/128]
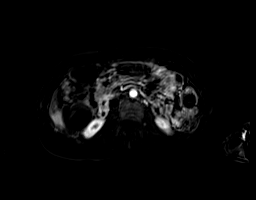
[im 128/128]
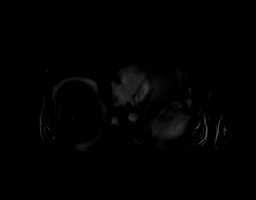

[Series 18: T1 dynamic · axial · 3.0mm · 1.64mm/px · z∈[-283,+98]mm · 4 of 128 slices shown (4 of 10)]
[im 1/128]
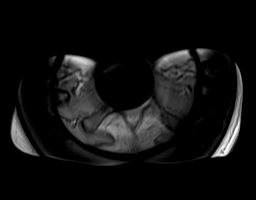
[im 43/128]
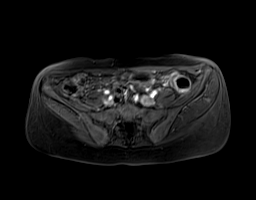
[im 85/128]
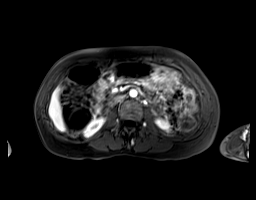
[im 128/128]
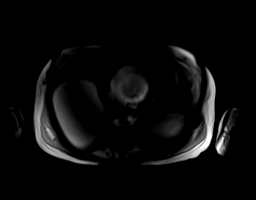

[Series 19: T1 dynamic · axial · 3.0mm · 1.64mm/px · z∈[-283,+98]mm · 4 of 128 slices shown (5 of 10)]
[im 1/128]
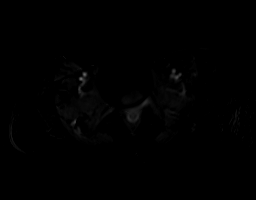
[im 43/128]
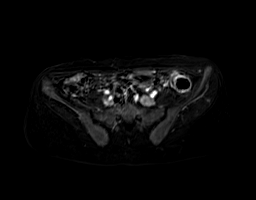
[im 85/128]
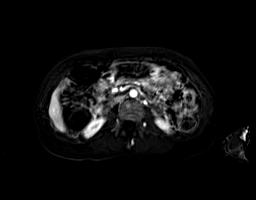
[im 128/128]
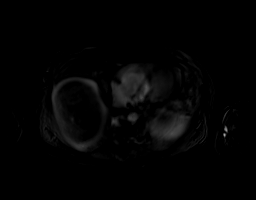

[Series 20: T1 dynamic · axial · 3.0mm · 1.64mm/px · z∈[-283,+98]mm · 4 of 128 slices shown (6 of 10)]
[im 1/128]
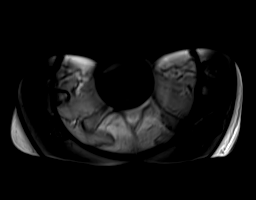
[im 43/128]
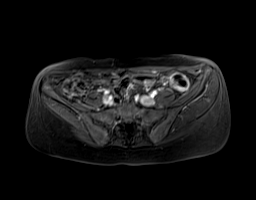
[im 85/128]
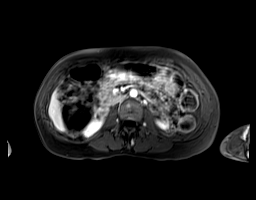
[im 128/128]
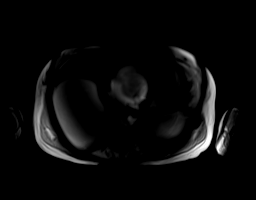

[Series 21: T1 dynamic · axial · 3.0mm · 1.64mm/px · z∈[-283,+98]mm · 4 of 128 slices shown (7 of 10)]
[im 1/128]
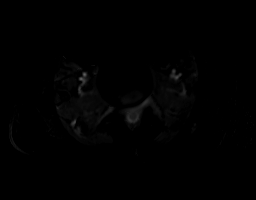
[im 43/128]
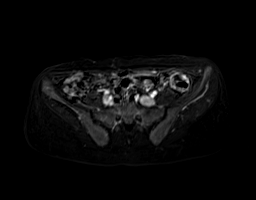
[im 85/128]
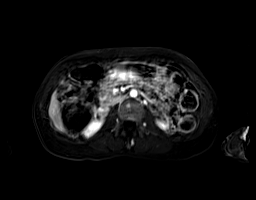
[im 128/128]
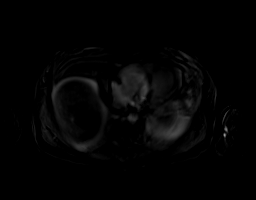

[Series 23: T1 dynamic · coronal · 1.6mm · 1.76mm/px · 4 of 128 slices shown (8 of 10)]
[im 1/128]
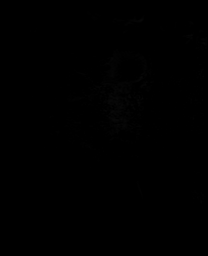
[im 43/128]
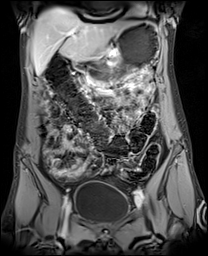
[im 85/128]
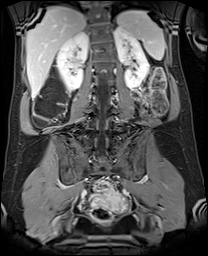
[im 128/128]
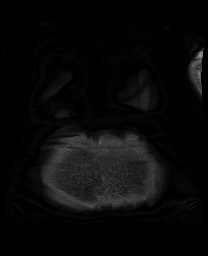

[Series 24: T1 dynamic · axial · 3.0mm · 1.64mm/px · z∈[-283,+98]mm · 4 of 128 slices shown (9 of 10)]
[im 1/128]
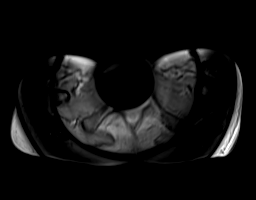
[im 43/128]
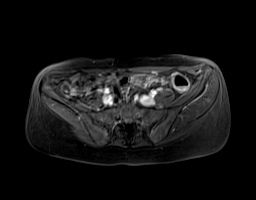
[im 85/128]
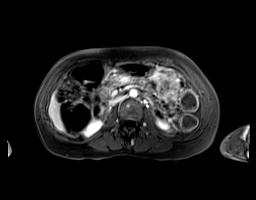
[im 128/128]
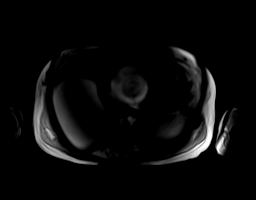

[Series 25: T1 dynamic · axial · 3.0mm · 1.64mm/px · z∈[-283,+98]mm · 4 of 128 slices shown (10 of 10)]
[im 1/128]
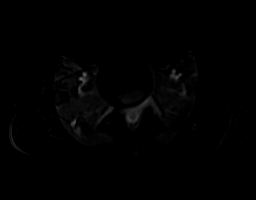
[im 43/128]
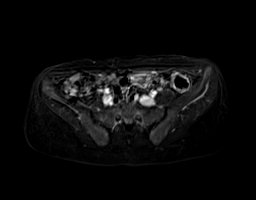
[im 85/128]
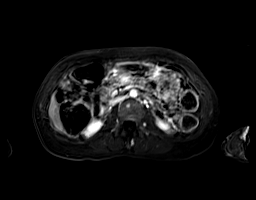
[im 128/128]
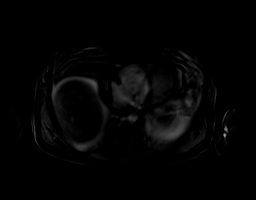

[48 of 48 positions shown; findings below may reference images not displayed]

FINDINGS: COMBINED FINDINGS FOR BOTH MR ABDOMEN AND PELVIS

Lower chest: No acute findings.

Hepatobiliary: No mass or other parenchymal abnormality identified.
Status post cholecystectomy. Mild postoperative biliary ductal
dilatation.

Pancreas: No mass, inflammatory changes, or other parenchymal
abnormality identified.No pancreatic ductal dilatation.

Spleen:  Within normal limits in size and appearance.

Adrenals/Urinary Tract: Normal adrenal glands. No renal masses or
suspicious contrast enhancement identified. No evidence of
hydronephrosis. Normal urinary bladder.

Stomach/Bowel: Small bowel is somewhat underdistended, not optimal
for evaluation. No evidence of bowel obstruction. Normal appendix.
Mildly thickened, hyperenhancing appearance of the descending colon,
sigmoid colon and rectum, which is in a somewhat tethered appearing
configuration similar to prior CT. The proximal colon and distal
small bowel are generally normal in appearance. No evidence of
stricture, fistula, or abscess.

Vascular/Lymphatic: No pathologically enlarged lymph nodes
identified. No abdominal aortic aneurysm demonstrated.

Reproductive: Normal uterus.  Numerous bilateral ovarian follicles.

Other:  None.

Musculoskeletal: No suspicious osseous lesions identified.
IMPRESSION: 1. Mildly thickened, hyperenhancing appearance of the descending
colon, sigmoid colon and rectum, which is in a somewhat tethered
appearing configuration, appearance similar to prior CT. Findings
remain consistent with nonspecific infectious or inflammatory
colitis, inflammatory bowel disease including both Crohn's disease
and ulcerative colitis are the primary differential considerations.
2. No evidence of complicating stricture, fistula, or abscess.
3. No evidence of bowel obstruction.

## 2022-05-21 IMAGING — MR MR ^MR ENTEROGRAPHY W/WO
15 series · 48 of 48 positions shown · IV contrast (6ml Gadavist)
Comparison: CT abdomen pelvis, [DATE], [DATE]

CLINICAL DATA: Rule out Crohn's disease, history of colitis

EXAM:
MR ABDOMEN AND PELVIS WITHOUT AND WITH CONTRAST (MR ENTEROGRAPHY)
TECHNIQUE: Multiplanar, multisequence MRI of the abdomen and pelvis was
performed both before and during bolus administration of intravenous
contrast. Negative oral contrast VoLumen was given.
CONTRAST:  6mL GADAVIST GADOBUTROL 1 MMOL/ML IV SOLN, additional
negative oral enteric contrast

[Series 6: t2_haste_tra_p2_mbh_comp · axial · 6.0mm · 1.25mm/px · 1 of 74 slices shown]
[im 1/74]
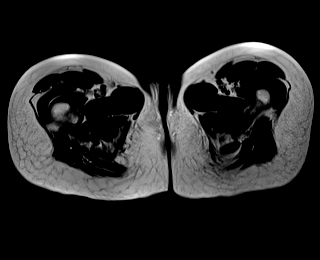

[Series 7: cor haste mbh · coronal · 5.0mm · 0.88mm/px · 1 of 41 slices shown]
[im 1/41]
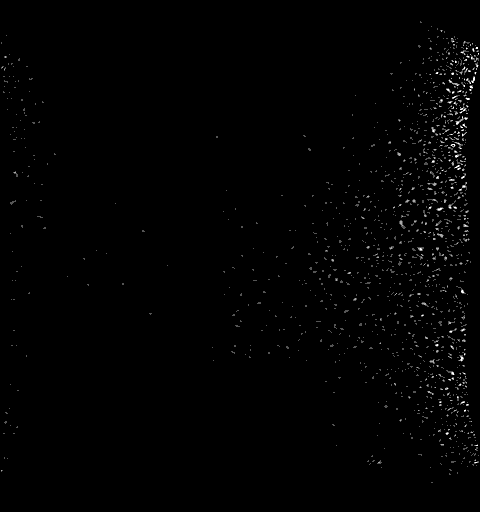

[Series 12: ax dwi_adc_comp · axial · 6.0mm · 1.49mm/px · 1 of 76 slices shown]
[im 1/76]
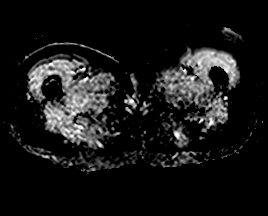

[Series 13: ax dwi_tracew_comp · axial · 6.0mm · 1.49mm/px · z∈[-337,+113]mm · 5 of 228 slices shown]
[im 1/228]
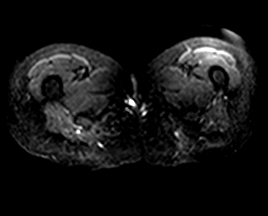
[im 57/228]
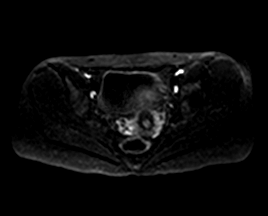
[im 114/228]
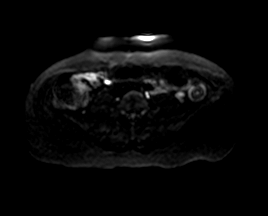
[im 171/228]
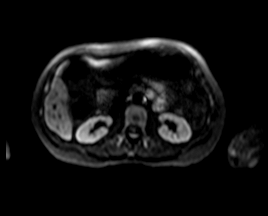
[im 228/228]
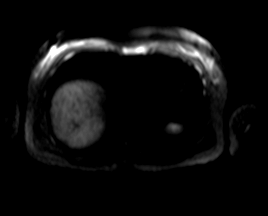

[Series 14: cor true fisp · coronal · 5.0mm · 0.70mm/px · 1 of 41 slices shown]
[im 1/41]
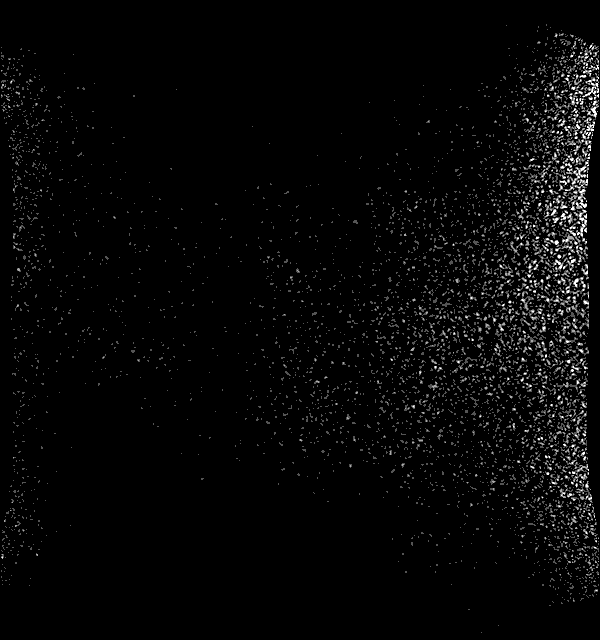

[Series 15: T1 dynamic · axial · 3.0mm · 1.64mm/px · z∈[-283,+98]mm · 3 of 128 slices shown (1 of 10)]
[im 1/128]
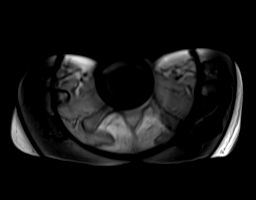
[im 64/128]
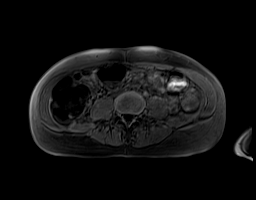
[im 128/128]
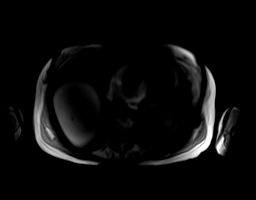

[Series 16: T1 dynamic · axial · 3.0mm · 1.64mm/px · z∈[-283,+98]mm · 4 of 128 slices shown (2 of 10)]
[im 1/128]
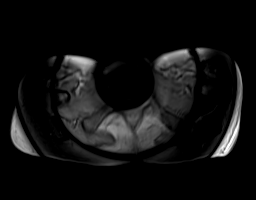
[im 43/128]
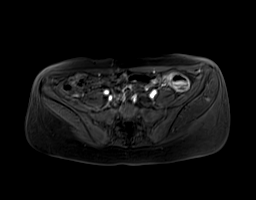
[im 85/128]
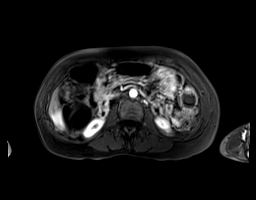
[im 128/128]
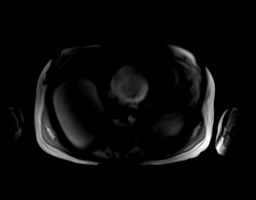

[Series 17: T1 dynamic · axial · 3.0mm · 1.64mm/px · z∈[-283,+98]mm · 4 of 128 slices shown (3 of 10)]
[im 1/128]
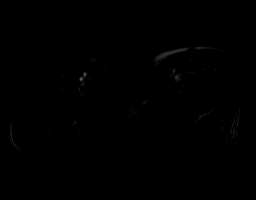
[im 43/128]
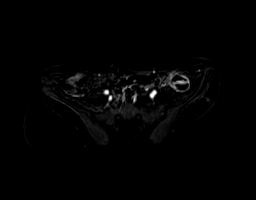
[im 85/128]
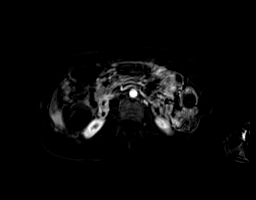
[im 128/128]
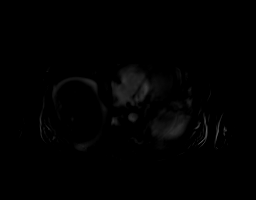

[Series 18: T1 dynamic · axial · 3.0mm · 1.64mm/px · z∈[-283,+98]mm · 4 of 128 slices shown (4 of 10)]
[im 1/128]
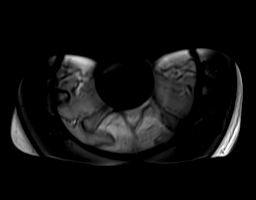
[im 43/128]
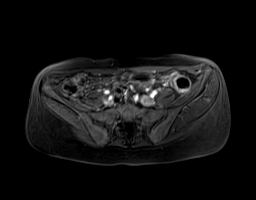
[im 85/128]
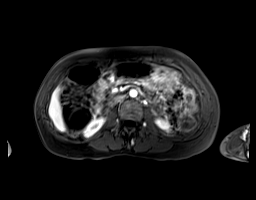
[im 128/128]
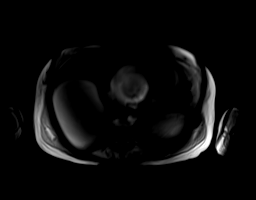

[Series 19: T1 dynamic · axial · 3.0mm · 1.64mm/px · z∈[-283,+98]mm · 4 of 128 slices shown (5 of 10)]
[im 1/128]
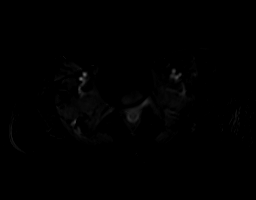
[im 43/128]
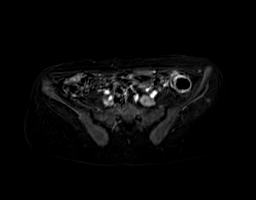
[im 85/128]
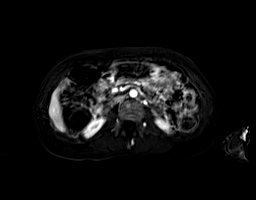
[im 128/128]
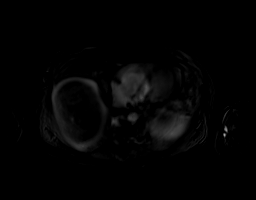

[Series 20: T1 dynamic · axial · 3.0mm · 1.64mm/px · z∈[-283,+98]mm · 4 of 128 slices shown (6 of 10)]
[im 1/128]
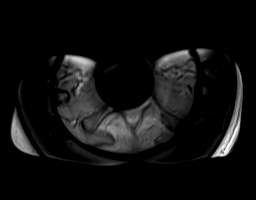
[im 43/128]
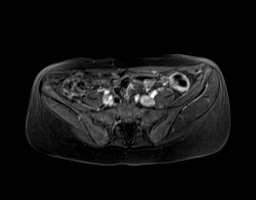
[im 85/128]
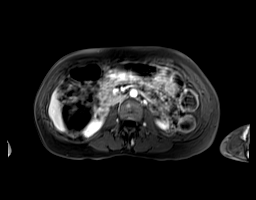
[im 128/128]
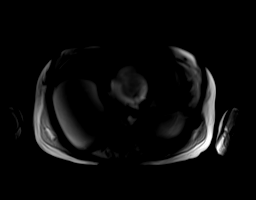

[Series 21: T1 dynamic · axial · 3.0mm · 1.64mm/px · z∈[-283,+98]mm · 4 of 128 slices shown (7 of 10)]
[im 1/128]
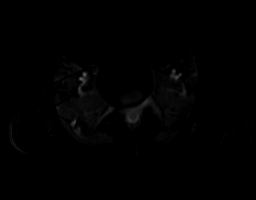
[im 43/128]
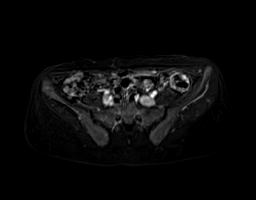
[im 85/128]
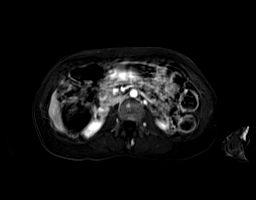
[im 128/128]
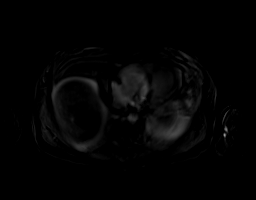

[Series 23: T1 dynamic · coronal · 1.6mm · 1.76mm/px · 4 of 128 slices shown (8 of 10)]
[im 1/128]
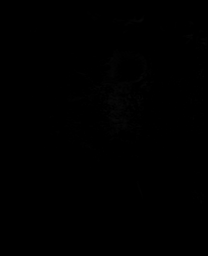
[im 43/128]
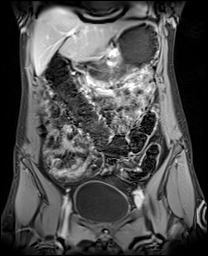
[im 85/128]
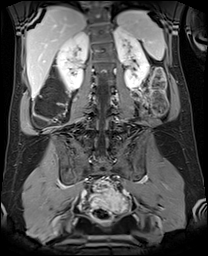
[im 128/128]
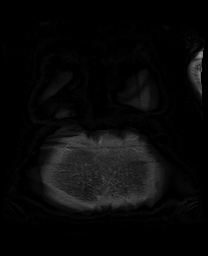

[Series 24: T1 dynamic · axial · 3.0mm · 1.64mm/px · z∈[-283,+98]mm · 4 of 128 slices shown (9 of 10)]
[im 1/128]
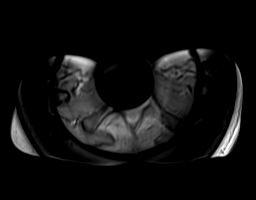
[im 43/128]
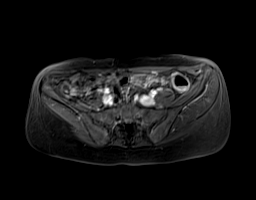
[im 85/128]
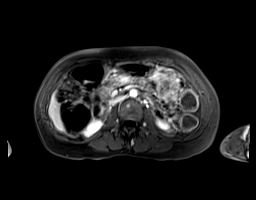
[im 128/128]
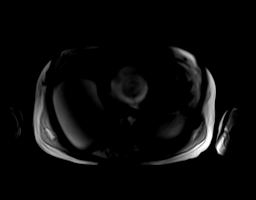

[Series 25: T1 dynamic · axial · 3.0mm · 1.64mm/px · z∈[-283,+98]mm · 4 of 128 slices shown (10 of 10)]
[im 1/128]
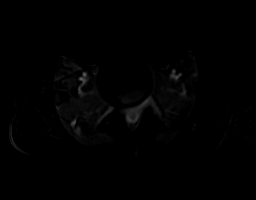
[im 43/128]
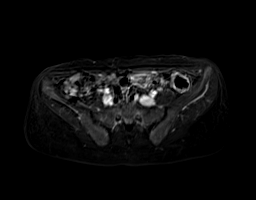
[im 85/128]
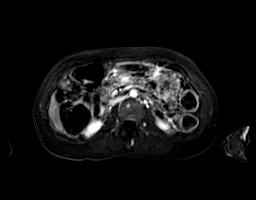
[im 128/128]
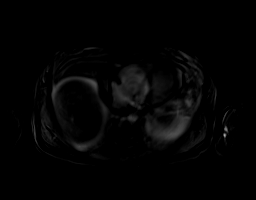

[48 of 48 positions shown; findings below may reference images not displayed]

FINDINGS: COMBINED FINDINGS FOR BOTH MR ABDOMEN AND PELVIS

Lower chest: No acute findings.

Hepatobiliary: No mass or other parenchymal abnormality identified.
Status post cholecystectomy. Mild postoperative biliary ductal
dilatation.

Pancreas: No mass, inflammatory changes, or other parenchymal
abnormality identified.No pancreatic ductal dilatation.

Spleen:  Within normal limits in size and appearance.

Adrenals/Urinary Tract: Normal adrenal glands. No renal masses or
suspicious contrast enhancement identified. No evidence of
hydronephrosis. Normal urinary bladder.

Stomach/Bowel: Small bowel is somewhat underdistended, not optimal
for evaluation. No evidence of bowel obstruction. Normal appendix.
Mildly thickened, hyperenhancing appearance of the descending colon,
sigmoid colon and rectum, which is in a somewhat tethered appearing
configuration similar to prior CT. The proximal colon and distal
small bowel are generally normal in appearance. No evidence of
stricture, fistula, or abscess.

Vascular/Lymphatic: No pathologically enlarged lymph nodes
identified. No abdominal aortic aneurysm demonstrated.

Reproductive: Normal uterus.  Numerous bilateral ovarian follicles.

Other:  None.

Musculoskeletal: No suspicious osseous lesions identified.
IMPRESSION: 1. Mildly thickened, hyperenhancing appearance of the descending
colon, sigmoid colon and rectum, which is in a somewhat tethered
appearing configuration, appearance similar to prior CT. Findings
remain consistent with nonspecific infectious or inflammatory
colitis, inflammatory bowel disease including both Crohn's disease
and ulcerative colitis are the primary differential considerations.
2. No evidence of complicating stricture, fistula, or abscess.
3. No evidence of bowel obstruction.

## 2022-05-21 MED ORDER — GADOBUTROL 1 MMOL/ML IV SOLN
6.0000 mL | Freq: Once | INTRAVENOUS | Status: AC | PRN
  Administered 2022-05-21: 6 mL via INTRAVENOUS

## 2022-05-22 LAB — CALPROTECTIN, FECAL: Calprotectin, Fecal: 152 ug/g — ABNORMAL HIGH (ref 0–120)

## 2022-05-22 NOTE — Progress Notes (Signed)
Video visit next week or Thursday after my last procedure to discuss results

## 2022-05-23 ENCOUNTER — Telehealth: Payer: Self-pay

## 2022-05-23 NOTE — Telephone Encounter (Signed)
Called patient to try to schedule her an appointment but was not able to communicate with her. I will send her a message through MyChart letting her know that Dr. Vicente Males wants to discuss her MRI results.

## 2022-05-23 NOTE — Telephone Encounter (Signed)
-----   Message from Jonathon Bellows, MD sent at 05/22/2022 11:57 AM EDT ----- Video visit next week or Thursday after my last procedure to discuss results

## 2022-05-24 ENCOUNTER — Telehealth (INDEPENDENT_AMBULATORY_CARE_PROVIDER_SITE_OTHER): Payer: BC Managed Care – PPO | Admitting: Gastroenterology

## 2022-05-24 DIAGNOSIS — R197 Diarrhea, unspecified: Secondary | ICD-10-CM | POA: Diagnosis not present

## 2022-06-01 ENCOUNTER — Other Ambulatory Visit: Payer: Self-pay

## 2022-07-03 ENCOUNTER — Telehealth: Payer: BC Managed Care – PPO | Admitting: Gastroenterology

## 2022-07-03 ENCOUNTER — Telehealth: Payer: Self-pay | Admitting: Gastroenterology

## 2022-07-03 NOTE — Telephone Encounter (Signed)
Patient left vm requesting a call back in reference to appointment for today being canceled.

## 2022-07-03 NOTE — Progress Notes (Deleted)
Jonathon Bellows , MD [address]  [place]  [place], [place] [address]  Main: [phone]  Fax: [phone]   Primary Care Physician: Jonathon Bellows, MD  Virtual Visit via Video Note  I connected with patient on 07/03/22 at  1:45 PM EDT by video and verified that I am speaking with the correct person using two identifiers.   I discussed the limitations, risks, security and privacy concerns of performing an evaluation and management service by video  and the availability of in person appointments. I also discussed with the patient that there may be a patient responsible charge related to this service. The patient expressed understanding and agreed to proceed.  Location of Patient: Home Location of Provider: Home Persons involved: Patient and provider only   History of Present Illness: No chief complaint on file.   HPI: Ashley Patel is a 41 y.o. female  Summary of history :   She went to her 6 office with abdominal pain the right side on 03/22/2022.  At that point of time she states she has lost weight 20-30 lbs  over the past 2 years.  She has had diarrhea for more than a few years.  Her brother has Crohn's disease.  She has 2-3 bowel movements a day.  Watery and at times has blood in it.  None presently.  Denies any long-term NSAID use.  She has symptoms of urgency nocturnal bowel movements and cramping.  Some joint pains she had some rash on the back which has improved recently. No family history of colon cancer or polyps.  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  Colonoscopy was recommended.  A similar CT was performed in May 2022 and no acute findings were noted.     04/19/2022: EGD: inflammation at GE junction : esophagitis  , chronic inactive gastritis , normal duoidenal mucosa , colonoscopy : small 5 mm polyp-benign  resected, aphthous ulcers in terminal ileum seen  Random colon bx were normal , TI bx showed lymphoid hyperplasia.  05/14/2022: Fecal calprotectin:   elevated at 152   05/22/2022:  Mildly thickened, hyperenhancing appearance of the descending colon, sigmoid colon and rectum, which is in a somewhat tethered appearing configuration, appearance similar to prior CT. Findings remain consistent with nonspecific infectious or inflammatory colitis, inflammatory bowel disease including both Crohn's disease and ulcerative colitis are the primary differential considerations.     Interval history  05/24/2022-07/03/2022      She states that since her last visit Diarrhea is almost resolved has not taken her Questran.  No other symptoms.  She states that for over many years she has had issues with diarrhea on and off alternating with constipation.        Current Outpatient Medications  Medication Sig Dispense Refill   busPIRone (BUSPAR) 15 MG tablet Take 1 twice daily 180 tablet 0   cholestyramine (QUESTRAN) 4 g packet Take 1 packet (4 g total) by mouth 2 (two) times daily. 60 each 12   citalopram (CELEXA) 20 MG tablet Take 1 daily  with breakfast 90 tablet 0   clonazePAM (KLONOPIN) 0.5 MG tablet Take 1 daily during week prior to menses 5 tablet 2   lamoTRIgine (LAMICTAL) 200 MG tablet Take 1 tablet by mouth daily 90  tablet 0   lamoTRIgine (LAMICTAL) 25 MG tablet Take 2 at bedtime 90 tablet 0   ondansetron (ZOFRAN) 4 MG tablet Take 1 tablet (4 mg total) by mouth every 8 (eight) hours as needed for nausea or vomiting. 20 tablet 0   traZODone (DESYREL) 100 MG tablet Take 1-2 tablets po nightly prn sleep 180 tablet 0   No current facility-administered medications for this visit.    Allergies as of 07/03/2022 - Review Complete 05/15/2022  Allergen Reaction Noted   Amoxil [amoxicillin]  02/18/2017     Review of Systems:    All systems reviewed and negative except where noted in HPI.  General Appearance:    Alert, cooperative, no distress, appears stated age  Head:    Normocephalic, without obvious abnormality, atraumatic  Eyes:    PERRL, conjunctiva/corneas clear,  Ears:    Grossly normal hearing    Neurologic:  Grossly normal    Observations/Objective:  Labs: CMP     Component Value Date/Time   NA 140 03/29/2022 1530   K 4.4 03/29/2022 1530   CL 102 03/29/2022 1530   CO2 26 03/29/2022 1530   GLUCOSE 86 03/29/2022 1530   GLUCOSE 88 06/28/2021 0907   BUN 15 03/29/2022 1530   CREATININE 0.85 03/29/2022 1530   CREATININE 0.85 06/28/2021 0907   CALCIUM 9.6 03/29/2022 1530   PROT 6.7 03/29/2022 1530   ALBUMIN 4.5 03/29/2022 1530   AST 12 03/29/2022 1530   ALT 12 03/29/2022 1530   ALKPHOS 83 03/29/2022 1530   BILITOT 0.4 03/29/2022 1530   GFRNONAA >60 04/28/2021 1421   GFRNONAA 111 03/25/2020 1028   GFRAA 129 03/25/2020 1028   Lab Results  Component Value Date   WBC 5.1 03/29/2022   HGB 13.5 03/29/2022   HCT 41.2 03/29/2022   MCV 88 03/29/2022   PLT 252 03/29/2022    Imaging Studies: No results found.  Assessment and Plan:   Ashley Patel is a 41 y.o. y/o female y/o female ***        I discussed the assessment and treatment plan with the patient. The patient was provided an opportunity to ask questions and all were answered. The patient agreed with the plan and demonstrated an understanding of the instructions.   The patient was advised to call back or seek an in-person evaluation if the symptoms worsen or if the condition fails to improve as anticipated.  I provided *** minutes of face-to-face time during this encounter.  Dr Jonathon Bellows MD,MRCP [facility]) Gastroenterology/Hepatology Pager: [phone]   Speech recognition software was used to dictate this note.

## 2022-07-03 NOTE — Telephone Encounter (Signed)
Called patient back but her answering machine was full. Therefore, I was not able to leave her a voicemail. I went ahead and sent her a patient message via MyChart patient message explaining that she was to follow up with Dr. Vicente Males in 3-4 months and not weeks.

## 2022-07-04 ENCOUNTER — Other Ambulatory Visit: Payer: Self-pay

## 2022-07-04 DIAGNOSIS — F39 Unspecified mood [affective] disorder: Secondary | ICD-10-CM | POA: Diagnosis not present

## 2022-07-04 DIAGNOSIS — F4312 Post-traumatic stress disorder, chronic: Secondary | ICD-10-CM | POA: Diagnosis not present

## 2022-07-04 DIAGNOSIS — F1221 Cannabis dependence, in remission: Secondary | ICD-10-CM | POA: Diagnosis not present

## 2022-07-04 DIAGNOSIS — F411 Generalized anxiety disorder: Secondary | ICD-10-CM | POA: Diagnosis not present

## 2022-07-04 MED ORDER — CLONAZEPAM 0.5 MG PO TABS
ORAL_TABLET | ORAL | 2 refills | Status: DC
  Filled 2022-07-04: qty 5, 30d supply, fill #0

## 2022-07-04 MED ORDER — CITALOPRAM HYDROBROMIDE 20 MG PO TABS
ORAL_TABLET | ORAL | 0 refills | Status: DC
  Filled 2022-07-04: qty 90, 90d supply, fill #0

## 2022-07-04 MED ORDER — BUSPIRONE HCL 15 MG PO TABS
ORAL_TABLET | ORAL | 0 refills | Status: DC
  Filled 2022-07-04: qty 180, 90d supply, fill #0

## 2022-07-04 MED ORDER — LAMOTRIGINE 25 MG PO TABS
ORAL_TABLET | ORAL | 0 refills | Status: DC
  Filled 2022-07-04: qty 90, 90d supply, fill #0

## 2022-07-04 MED ORDER — TRAZODONE HCL 100 MG PO TABS
ORAL_TABLET | ORAL | 0 refills | Status: DC
  Filled 2022-07-04: qty 180, 90d supply, fill #0

## 2022-07-04 MED ORDER — LAMOTRIGINE 200 MG PO TABS
ORAL_TABLET | Freq: Every day | ORAL | 0 refills | Status: DC
Start: 2022-07-04 — End: 2022-09-27
  Filled 2022-07-04 – 2022-08-14 (×2): qty 90, 90d supply, fill #0

## 2022-07-06 ENCOUNTER — Encounter: Payer: Self-pay | Admitting: Family Medicine

## 2022-07-26 ENCOUNTER — Other Ambulatory Visit: Payer: Self-pay

## 2022-08-10 ENCOUNTER — Telehealth: Payer: Self-pay

## 2022-08-14 ENCOUNTER — Other Ambulatory Visit: Payer: Self-pay

## 2022-09-15 NOTE — Telephone Encounter (Signed)
error 

## 2022-09-27 ENCOUNTER — Telehealth (INDEPENDENT_AMBULATORY_CARE_PROVIDER_SITE_OTHER): Payer: BC Managed Care – PPO | Admitting: Gastroenterology

## 2022-09-27 DIAGNOSIS — R197 Diarrhea, unspecified: Secondary | ICD-10-CM | POA: Diagnosis not present

## 2022-09-27 NOTE — Progress Notes (Signed)
Ashley Patel , MD [address]  [place]  [place], [place] [address]  Main: [phone]  Fax: [phone]   Primary Care Physician: Ashley Bellows, MD  Virtual Visit via Video Note  I connected with patient on 09/27/22 at  1:15 PM EDT by video and verified that I am speaking with the correct person using two identifiers.   I discussed the limitations, risks, security and privacy concerns of performing an evaluation and management service by video  and the availability of in person appointments. I also discussed with the patient that there may be a patient responsible charge related to this service. The patient expressed understanding and agreed to proceed.  Location of Patient: Home Location of Provider: Home Persons involved: Patient and provider only   History of Present Illness: Chief Complaint  Patient presents with   Ulcerative Colitis    HPI: Ashley Patel is a 41 y.o. female   Summary of history :   She went to her 53 office with abdominal pain the right side on 03/22/2022.  At that point of time she states she has lost weight 20-30 lbs  over the past 2 years.  Subsequently she underwent a CT scan of the abdomen pelvis with contrast on 03/22/2022 that showed diffuse colonic wall thickening extending from the mid transverse colon to the rectum/suspicious for infectious or inflammatory enteritis..  A similar CT was performed in May 2022 and no acute findings were noted.     She has had diarrhea for more than a few years.  Her brother has Crohn's disease.  She has 2-3 bowel movements a day.  Watery and at times has blood in it.  None presently.  Denies any long-term NSAID use.  She has symptoms of urgency nocturnal bowel movements and cramping.  Some joint pains she had some rash on the back which has improved recently.  No prior colonoscopy.  No family history of colon cancer or polyps.  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   04/19/2022: EGD:  inflammation at GE junction : esophagitis , chronic inactive gastritis , normal duoidenal mucosa , colonoscopy : small 5 mm polyp-benign  resected, aphthous ulcers in terminal ileum seen    Random colon bx were normal , TI bx showed lymphoid hyperplasia.    05/14/2022: Fecal calprotectin:   elevated at 152   05/22/2022:  Mildly thickened, hyperenhancing appearance of the descending colon, sigmoid colon and rectum, which is in a somewhat tethered appearing configuration, appearance similar to prior CT. Findings remain consistent with nonspecific infectious or inflammatory colitis, inflammatory bowel disease including both Crohn's disease and ulcerative colitis are the primary differential considerations.    Interval history  05/24/2022-09/27/2022  Since her last visit she is doing well takes Questran on and off.  Having 1-3 bowel movements a day.  No abdominal pain.  No NSAID use.  No other complaints.  She feels well overall.    Current Outpatient Medications  Medication Sig Dispense Refill   busPIRone (BUSPAR) 15 MG tablet Take 1 twice daily 180 tablet 0   cholestyramine (QUESTRAN) 4 g packet Take 1 packet (4 g total) by mouth 2 (two) times daily. 60 each 12   citalopram (CELEXA) 20 MG tablet Take 1 daily  with breakfast 90 tablet 0   clonazePAM (KLONOPIN) 0.5 MG tablet Take 1 daily during week prior to menses 5 tablet 2   lamoTRIgine (LAMICTAL) 25 MG tablet Take 2 at bedtime  90 tablet 0   ondansetron (ZOFRAN) 4 MG tablet Take 1 tablet (4 mg total) by mouth every 8 (eight) hours as needed for nausea or vomiting. 20 tablet 0   traZODone (DESYREL) 100 MG tablet Take 1-2 tablets po nightly prn sleep 180 tablet 0   No current facility-administered medications for this visit.    Allergies as of 09/27/2022 - Review Complete 09/27/2022  Allergen Reaction Noted   Amoxil [amoxicillin]  02/18/2017    Review of Systems:    All systems reviewed and negative except where noted in HPI.   General Appearance:    Alert, cooperative, no distress, appears stated age  Head:    Normocephalic, without obvious abnormality, atraumatic  Eyes:    PERRL, conjunctiva/corneas clear,  Ears:    Grossly normal hearing    Neurologic:  Grossly normal    Observations/Objective:  Labs: CMP     Component Value Date/Time   NA 140 03/29/2022 1530   K 4.4 03/29/2022 1530   CL 102 03/29/2022 1530   CO2 26 03/29/2022 1530   GLUCOSE 86 03/29/2022 1530   GLUCOSE 88 06/28/2021 0907   BUN 15 03/29/2022 1530   CREATININE 0.85 03/29/2022 1530   CREATININE 0.85 06/28/2021 0907   CALCIUM 9.6 03/29/2022 1530   PROT 6.7 03/29/2022 1530   ALBUMIN 4.5 03/29/2022 1530   AST 12 03/29/2022 1530   ALT 12 03/29/2022 1530   ALKPHOS 83 03/29/2022 1530   BILITOT 0.4 03/29/2022 1530   GFRNONAA >60 04/28/2021 1421   GFRNONAA 111 03/25/2020 1028   GFRAA 129 03/25/2020 1028   Lab Results  Component Value Date   WBC 5.1 03/29/2022   HGB 13.5 03/29/2022   HCT 41.2 03/29/2022   MCV 88 03/29/2022   PLT 252 03/29/2022    Imaging Studies: No results found.  Assessment and Plan:   Ashley Patel is a 41 y.o. y/o femalehere to follow-up for abdominal pain and diarrhea.  Brother has Crohn's disease.  I performed EGD and colonoscopy and found aphthous ulceration in the terminal ileum biopsies Ashley Patel appeared completely normal on pathology.  Biopsies of the remainder of the colon were also normal.  She has a history of a cholecystectomy.  My initial differentials were post cholecystectomy bile salt diarrhea versus Crohn's disease.  Fecal calprotectin is elevated.  She has been told that the differentials would include bile salt mediated diarrhea versus Crohn's disease versus SIBO.  Presently she is doing very well she says this is the best she has felt for a long time.  MR enterogram showed no abnormalities in the distal small bowel hence at this point of time I am not inclined to escalate her treatment.  I  explained to her that we shall watch and wait and she can use Questran for bile salt mediated diarrhea if she were to develop abdominal pain or worsening of diarrhea or any GI symptoms to contact me ASAP and then we will have to perform a colonoscopy with terminal ileal intubation to reevaluate the diagnosis of Crohn's disease.  At this point of time she does not carry the diagnosis of Crohn's disease.            I discussed the assessment and treatment plan with the patient. The patient was provided an opportunity to ask questions and all were answered. The patient agreed with the plan and demonstrated an understanding of the instructions.   The patient was advised to call back or seek an in-person evaluation if  the symptoms worsen or if the condition fails to improve as anticipated.  I provided 15 minutes of face-to-face time during this encounter.  Dr Ashley Bellows MD,MRCP [facility]) Gastroenterology/Hepatology Pager: [phone]   Speech recognition software was used to dictate this note.

## 2022-11-15 ENCOUNTER — Other Ambulatory Visit: Payer: Self-pay

## 2022-11-15 DIAGNOSIS — F4312 Post-traumatic stress disorder, chronic: Secondary | ICD-10-CM | POA: Diagnosis not present

## 2022-11-15 DIAGNOSIS — F39 Unspecified mood [affective] disorder: Secondary | ICD-10-CM | POA: Diagnosis not present

## 2022-11-15 DIAGNOSIS — F411 Generalized anxiety disorder: Secondary | ICD-10-CM | POA: Diagnosis not present

## 2022-11-15 DIAGNOSIS — F1221 Cannabis dependence, in remission: Secondary | ICD-10-CM | POA: Diagnosis not present

## 2022-11-15 MED ORDER — TRAZODONE HCL 100 MG PO TABS
100.0000 mg | ORAL_TABLET | Freq: Every evening | ORAL | 0 refills | Status: DC | PRN
  Filled 2022-11-15: qty 180, 90d supply, fill #0

## 2022-11-15 MED ORDER — BUSPIRONE HCL 15 MG PO TABS
15.0000 mg | ORAL_TABLET | Freq: Two times a day (BID) | ORAL | 0 refills | Status: DC
  Filled 2022-11-15: qty 180, 90d supply, fill #0

## 2022-11-15 MED ORDER — LAMOTRIGINE 200 MG PO TABS
200.0000 mg | ORAL_TABLET | Freq: Every day | ORAL | 0 refills | Status: DC
  Filled 2022-11-15: qty 90, 90d supply, fill #0

## 2022-11-15 MED ORDER — CITALOPRAM HYDROBROMIDE 20 MG PO TABS
20.0000 mg | ORAL_TABLET | Freq: Every day | ORAL | 0 refills | Status: DC
Start: 2022-11-15 — End: 2024-02-03
  Filled 2022-11-15: qty 90, 90d supply, fill #0

## 2022-11-15 MED ORDER — LAMOTRIGINE 25 MG PO TABS
50.0000 mg | ORAL_TABLET | Freq: Every day | ORAL | 0 refills | Status: DC
  Filled 2022-11-15: qty 90, 45d supply, fill #0

## 2022-11-15 MED ORDER — CLONAZEPAM 0.5 MG PO TABS
ORAL_TABLET | ORAL | 2 refills | Status: DC
  Filled 2022-11-15: qty 5, 30d supply, fill #0

## 2022-11-16 ENCOUNTER — Encounter: Payer: Self-pay | Admitting: Gastroenterology

## 2022-11-27 ENCOUNTER — Other Ambulatory Visit: Payer: Self-pay

## 2023-04-19 ENCOUNTER — Other Ambulatory Visit (HOSPITAL_COMMUNITY): Payer: Self-pay

## 2023-08-19 ENCOUNTER — Encounter: Payer: Self-pay | Admitting: Family Medicine

## 2023-08-19 ENCOUNTER — Ambulatory Visit (INDEPENDENT_AMBULATORY_CARE_PROVIDER_SITE_OTHER): Payer: BC Managed Care – PPO | Admitting: Family Medicine

## 2023-08-19 ENCOUNTER — Other Ambulatory Visit: Payer: Self-pay

## 2023-08-19 ENCOUNTER — Telehealth: Payer: Self-pay | Admitting: Gastroenterology

## 2023-08-19 VITALS — BP 128/74 | HR 89 | Temp 97.3°F | Resp 16 | Ht 69.0 in | Wt 162.8 lb

## 2023-08-19 DIAGNOSIS — Z1231 Encounter for screening mammogram for malignant neoplasm of breast: Secondary | ICD-10-CM

## 2023-08-19 DIAGNOSIS — M419 Scoliosis, unspecified: Secondary | ICD-10-CM

## 2023-08-19 DIAGNOSIS — R0789 Other chest pain: Secondary | ICD-10-CM | POA: Diagnosis not present

## 2023-08-19 DIAGNOSIS — K219 Gastro-esophageal reflux disease without esophagitis: Secondary | ICD-10-CM | POA: Diagnosis not present

## 2023-08-19 MED ORDER — OMEPRAZOLE 40 MG PO CPDR
40.0000 mg | DELAYED_RELEASE_CAPSULE | Freq: Every day | ORAL | 0 refills | Status: DC
  Filled 2023-08-19: qty 30, 30d supply, fill #0

## 2023-08-19 NOTE — Telephone Encounter (Signed)
Referral Request - Has patient seen PCP for this complaint? yes *If NO, is insurance requiring patient see PCP for this issue before PCP can refer them? Referral for which specialty: mammogram Preferred provider/office: ? Reason for referral: routine mammo

## 2023-08-19 NOTE — Progress Notes (Unsigned)
Patient ID: Ashley Patel, female    DOB: [birth year], 42 y.o.   MRN: [id number]  PCP: Wyline Mood, MD  Chief Complaint  Patient presents with   Gastroesophageal Reflux   Chest Pain    A dull ache on left side pt states its a bone tender at touch but no chest pain related, ongoing since last week on/off    Subjective:   Ashley Patel is a 42 y.o. female, presents to clinic with CC of the following:  HPI   GERD - tums hasn't helped  Patient Active Problem List   Diagnosis Date Noted   Mood disorder (HCC) 12/28/2020   Insomnia due to other mental disorder 12/28/2020   Premenstrual dysphoria 03/25/2020   Acid reflux 03/14/2018   Panic attack 08/11/2015   B12 deficiency 08/05/2015   Acne 08/05/2015   Blood glucose elevated 08/05/2015   IBS (irritable bowel syndrome) 08/05/2015   Numerous moles 08/05/2015   Vitamin D deficiency 08/05/2015   Family history of thyroid disease 08/05/2015   Asthma without status asthmaticus 06/16/2010      Current Outpatient Medications:    busPIRone (BUSPAR) 15 MG tablet, Take 1 tablet (15 mg total) by mouth 2 (two) times daily., Disp: 180 tablet, Rfl: 0   traZODone (DESYREL) 100 MG tablet, Take 1-2 tablets (100-200 mg total) by mouth at bedtime as needed for sleep., Disp: 180 tablet, Rfl: 0   cholestyramine (QUESTRAN) 4 g packet, Take 1 packet (4 g total) by mouth 2 (two) times daily. (Patient not taking: Reported on 08/19/2023), Disp: 60 each, Rfl: 12   citalopram (CELEXA) 20 MG tablet, Take 1 tablet (20 mg total) by mouth daily with breakfast. (Patient not taking: Reported on 08/19/2023), Disp: 90 tablet, Rfl: 0   clonazePAM (KLONOPIN) 0.5 MG tablet, Take 1 daily during week prior to menses (Patient not taking: Reported on 08/19/2023), Disp: 5 tablet, Rfl: 2   lamoTRIgine (LAMICTAL) 200 MG tablet, Take 1 tablet (200 mg total) by mouth daily. (Patient not taking: Reported on 08/19/2023), Disp: 90 tablet, Rfl: 0   lamoTRIgine (LAMICTAL) 25 MG  tablet, Take 2 tablets (50 mg total) by mouth at bedtime. (Patient not taking: Reported on 08/19/2023), Disp: 90 tablet, Rfl: 0   ondansetron (ZOFRAN) 4 MG tablet, Take 1 tablet (4 mg total) by mouth every 8 (eight) hours as needed for nausea or vomiting. (Patient not taking: Reported on 08/19/2023), Disp: 20 tablet, Rfl: 0   Allergies  Allergen Reactions   Amoxil [Amoxicillin]     Severe yeast      Social History   Tobacco Use   Smoking status: Former    Current packs/day: 0.00    Average packs/day: 0.5 packs/day for 5.9 years (3.0 ttl pk-yrs)    Types: Cigarettes    Start date: 12/04/2003    Quit date: 11/04/2009    Years since quitting: 13.7   Smokeless tobacco: Never  Vaping Use   Vaping status: Never Used  Substance Use Topics   Alcohol use: Not Currently   Drug use: Yes    Types: Marijuana    Comment: a couple days ago      Chart Review Today: ***  Review of Systems     Objective:   Vitals:   08/19/23 1321  BP: 128/74  Pulse: 89  Resp: 16  Temp: (!) 97.3 F (36.3 C)  TempSrc: Temporal  SpO2: 98%  Weight: 162 lb 12.8 oz (73.8 kg)  Height: 5\' 9"  (1.753 m)  Body mass index is 24.04 kg/m.  Physical Exam   Results for orders placed or performed in visit on 05/08/22  Calprotectin, Fecal   Specimen: Stool  Result Value Ref Range   Calprotectin, Fecal 152 (H) 0 - 120 ug/g       Assessment & Plan:   ***     Danelle Berry, PA-C 08/19/23 1:37 PM

## 2023-08-19 NOTE — Patient Instructions (Signed)
You can try pepcid 20 mg up to twice a day for acid reflux I suggest you try a few weeks of a PPI - proton pump inhibitor - like omeprazole - which will work more preventatively and slowly. While you are getting started on the PPI you can still use the pepcid or tums for your symptoms  Try to wean off the PPI in 2 to 4 weeks and follow up with Dr. Tobi Bastos if you can't wean off the medications.

## 2023-08-19 NOTE — Telephone Encounter (Signed)
Order placed

## 2023-08-20 ENCOUNTER — Ambulatory Visit
Admission: RE | Admit: 2023-08-20 | Discharge: 2023-08-20 | Disposition: A | Discharge status: Home / Self Care | Payer: BC Managed Care – PPO | Source: Ambulatory Visit | Attending: Family Medicine | Admitting: Family Medicine

## 2023-08-20 ENCOUNTER — Ambulatory Visit
Admission: RE | Admit: 2023-08-20 | Discharge: 2023-08-20 | Disposition: A | Discharge status: Home / Self Care | Payer: BC Managed Care – PPO | Attending: Family Medicine | Admitting: Family Medicine

## 2023-08-20 DIAGNOSIS — R0789 Other chest pain: Secondary | ICD-10-CM | POA: Insufficient documentation

## 2023-08-20 DIAGNOSIS — R0781 Pleurodynia: Secondary | ICD-10-CM | POA: Diagnosis not present

## 2024-01-13 ENCOUNTER — Ambulatory Visit
Admission: RE | Admit: 2024-01-13 | Discharge: 2024-01-13 | Disposition: A | Discharge status: Home / Self Care | Payer: BC Managed Care – PPO | Source: Ambulatory Visit | Attending: Family Medicine | Admitting: Family Medicine

## 2024-01-13 DIAGNOSIS — Z1231 Encounter for screening mammogram for malignant neoplasm of breast: Secondary | ICD-10-CM | POA: Diagnosis not present

## 2024-02-03 ENCOUNTER — Encounter: Payer: BC Managed Care – PPO | Admitting: Family Medicine

## 2024-02-03 ENCOUNTER — Encounter: Payer: Self-pay | Admitting: Family Medicine

## 2024-02-03 ENCOUNTER — Ambulatory Visit: Payer: BC Managed Care – PPO | Admitting: Family Medicine

## 2024-02-03 ENCOUNTER — Other Ambulatory Visit (HOSPITAL_COMMUNITY)
Admission: RE | Admit: 2024-02-03 | Discharge: 2024-02-03 | Disposition: A | Discharge status: Home / Self Care | Source: Ambulatory Visit | Attending: Family Medicine | Admitting: Family Medicine

## 2024-02-03 VITALS — BP 112/70 | HR 84 | Temp 97.9°F | Resp 16 | Ht 69.0 in | Wt 164.5 lb

## 2024-02-03 DIAGNOSIS — E559 Vitamin D deficiency, unspecified: Secondary | ICD-10-CM

## 2024-02-03 DIAGNOSIS — Z124 Encounter for screening for malignant neoplasm of cervix: Secondary | ICD-10-CM

## 2024-02-03 DIAGNOSIS — R7989 Other specified abnormal findings of blood chemistry: Secondary | ICD-10-CM | POA: Diagnosis not present

## 2024-02-03 DIAGNOSIS — Z0001 Encounter for general adult medical examination with abnormal findings: Secondary | ICD-10-CM | POA: Diagnosis not present

## 2024-02-03 DIAGNOSIS — D229 Melanocytic nevi, unspecified: Secondary | ICD-10-CM

## 2024-02-03 DIAGNOSIS — Z130 Encounter for screening for diseases of the blood and blood-forming organs and certain disorders involving the immune mechanism: Secondary | ICD-10-CM

## 2024-02-03 DIAGNOSIS — Z13220 Encounter for screening for lipoid disorders: Secondary | ICD-10-CM

## 2024-02-03 DIAGNOSIS — Z0000 Encounter for general adult medical examination without abnormal findings: Secondary | ICD-10-CM

## 2024-02-03 DIAGNOSIS — Z131 Encounter for screening for diabetes mellitus: Secondary | ICD-10-CM
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
## 2024-02-04 LAB — COMPLETE METABOLIC PANEL WITH GFR
AG Ratio: 1.7 (calc) (ref 1.0–2.5)
ALT: 13 U/L (ref 6–29)
AST: 14 U/L (ref 10–30)
Albumin: 4.5 g/dL (ref 3.6–5.1)
Alkaline phosphatase (APISO): 78 U/L (ref 31–125)
BUN: 15 mg/dL (ref 7–25)
CO2: 30 mmol/L (ref 20–32)
Calcium: 9.9 mg/dL (ref 8.6–10.2)
Chloride: 104 mmol/L (ref 98–110)
Creat: 0.76 mg/dL (ref 0.50–0.99)
Globulin: 2.6 g/dL (ref 1.9–3.7)
Glucose, Bld: 89 mg/dL (ref 65–99)
Potassium: 4.9 mmol/L (ref 3.5–5.3)
Sodium: 139 mmol/L (ref 135–146)
Total Bilirubin: 0.6 mg/dL (ref 0.2–1.2)
Total Protein: 7.1 g/dL (ref 6.1–8.1)
eGFR: 102 mL/min/{1.73_m2} (ref >=60)

## 2024-02-04 LAB — LIPID PANEL
Cholesterol: 203 mg/dL — ABNORMAL HIGH (ref <200)
HDL: 70 mg/dL (ref >=50)
LDL Cholesterol (Calc): 114 mg/dL — ABNORMAL HIGH
Non-HDL Cholesterol (Calc): 133 mg/dL — ABNORMAL HIGH (ref <130)
Total CHOL/HDL Ratio: 2.9 (calc) (ref <5.0)
Triglycerides: 87 mg/dL (ref <150)

## 2024-02-04 LAB — CBC WITH DIFFERENTIAL/PLATELET
Absolute Lymphocytes: 2491 {cells}/uL (ref 850–3900)
Absolute Monocytes: 590 {cells}/uL (ref 200–950)
Basophils Absolute: 50 {cells}/uL (ref 0–200)
Basophils Relative: 0.7 %
Eosinophils Absolute: 158 {cells}/uL (ref 15–500)
Eosinophils Relative: 2.2 %
HCT: 45.8 % — ABNORMAL HIGH (ref 35.0–45.0)
Hemoglobin: 14.6 g/dL (ref 11.7–15.5)
MCH: 28.5 pg (ref 27.0–33.0)
MCHC: 31.9 g/dL — ABNORMAL LOW (ref 32.0–36.0)
MCV: 89.5 fL (ref 80.0–100.0)
MPV: 10.1 fL (ref 7.5–12.5)
Monocytes Relative: 8.2 %
Neutro Abs: 3910 {cells}/uL (ref 1500–7800)
Neutrophils Relative %: 54.3 %
Platelets: 321 10*3/uL (ref 140–400)
RBC: 5.12 10*6/uL — ABNORMAL HIGH (ref 3.80–5.10)
RDW: 11.9 % (ref 11.0–15.0)
Total Lymphocyte: 34.6 %
WBC: 7.2 10*3/uL (ref 3.8–10.8)

## 2024-02-04 LAB — HEMOGLOBIN A1C
Hgb A1c MFr Bld: 5.3 %{Hb} (ref <5.7)
Mean Plasma Glucose: 105 mg/dL
eAG (mmol/L): 5.8 mmol/L

## 2024-02-04 LAB — TSH: TSH: 3.12 m[IU]/L

## 2024-02-04 LAB — VITAMIN D 25 HYDROXY (VIT D DEFICIENCY, FRACTURES): Vit D, 25-Hydroxy: 9 ng/mL — ABNORMAL LOW (ref 30–100)

## 2024-02-06 ENCOUNTER — Encounter: Payer: Self-pay | Admitting: Family Medicine

## 2024-02-11 LAB — CYTOLOGY - PAP
Comment: NEGATIVE
Diagnosis: NEGATIVE
High risk HPV: NEGATIVE

## 2024-03-05 ENCOUNTER — Ambulatory Visit: Admitting: Family Medicine

## 2024-04-21 DIAGNOSIS — D2272 Melanocytic nevi of left lower limb, including hip: Secondary | ICD-10-CM | POA: Diagnosis not present

## 2024-04-21 DIAGNOSIS — D485 Neoplasm of uncertain behavior of skin: Secondary | ICD-10-CM | POA: Diagnosis not present

## 2024-04-21 DIAGNOSIS — D225 Melanocytic nevi of trunk: Secondary | ICD-10-CM | POA: Diagnosis not present

## 2024-04-21 DIAGNOSIS — D2262 Melanocytic nevi of left upper limb, including shoulder: Secondary | ICD-10-CM | POA: Diagnosis not present

## 2024-04-21 DIAGNOSIS — D235 Other benign neoplasm of skin of trunk: Secondary | ICD-10-CM | POA: Diagnosis not present

## 2024-04-21 DIAGNOSIS — D2261 Melanocytic nevi of right upper limb, including shoulder: Secondary | ICD-10-CM | POA: Diagnosis not present

## 2024-05-27 DIAGNOSIS — L905 Scar conditions and fibrosis of skin: Secondary | ICD-10-CM | POA: Diagnosis not present

## 2024-05-27 DIAGNOSIS — D225 Melanocytic nevi of trunk: Secondary | ICD-10-CM | POA: Diagnosis not present

## 2024-12-18 ENCOUNTER — Other Ambulatory Visit (HOSPITAL_COMMUNITY): Payer: Self-pay
